# Patient Record
Sex: Male | Born: 1935 | Race: White | Marital: Married | Smoking: Former smoker
Health system: Southern US, Community
[De-identification: ages and names within clinical notes are randomized; demographics above are authoritative.]

## PROBLEM LIST (undated history)

## (undated) DIAGNOSIS — I639 Cerebral infarction, unspecified: Secondary | ICD-10-CM

## (undated) DIAGNOSIS — I10 Essential (primary) hypertension: Secondary | ICD-10-CM

## (undated) DIAGNOSIS — C801 Malignant (primary) neoplasm, unspecified: Secondary | ICD-10-CM

## (undated) DIAGNOSIS — E785 Hyperlipidemia, unspecified: Secondary | ICD-10-CM

## (undated) DIAGNOSIS — I6529 Occlusion and stenosis of unspecified carotid artery: Secondary | ICD-10-CM

## (undated) HISTORY — DX: Malignant (primary) neoplasm, unspecified: C80.1

## (undated) HISTORY — DX: Hyperlipidemia, unspecified: E78.5

## (undated) HISTORY — DX: Cerebral infarction, unspecified: I63.9

## (undated) HISTORY — DX: Essential (primary) hypertension: I10

## (undated) HISTORY — DX: Occlusion and stenosis of unspecified carotid artery: I65.29

## (undated) HISTORY — PX: CATARACT EXTRACTION, BILATERAL: SHX1313

---

## 2009-12-29 ENCOUNTER — Ambulatory Visit: Payer: Self-pay | Admitting: Ophthalmology

## 2010-01-18 ENCOUNTER — Ambulatory Visit: Payer: Self-pay | Admitting: Vascular Surgery

## 2010-01-22 ENCOUNTER — Ambulatory Visit: Payer: Self-pay | Admitting: Vascular Surgery

## 2010-12-28 DIAGNOSIS — I359 Nonrheumatic aortic valve disorder, unspecified: Secondary | ICD-10-CM

## 2015-07-27 DIAGNOSIS — E119 Type 2 diabetes mellitus without complications: Secondary | ICD-10-CM | POA: Diagnosis not present

## 2015-07-27 DIAGNOSIS — Z79899 Other long term (current) drug therapy: Secondary | ICD-10-CM | POA: Diagnosis not present

## 2015-07-27 DIAGNOSIS — Z7982 Long term (current) use of aspirin: Secondary | ICD-10-CM | POA: Diagnosis not present

## 2015-07-27 DIAGNOSIS — Z87891 Personal history of nicotine dependence: Secondary | ICD-10-CM | POA: Diagnosis not present

## 2015-07-27 DIAGNOSIS — I10 Essential (primary) hypertension: Secondary | ICD-10-CM | POA: Diagnosis not present

## 2015-07-27 DIAGNOSIS — S46911A Strain of unspecified muscle, fascia and tendon at shoulder and upper arm level, right arm, initial encounter: Secondary | ICD-10-CM | POA: Diagnosis not present

## 2015-07-27 DIAGNOSIS — X58XXXA Exposure to other specified factors, initial encounter: Secondary | ICD-10-CM | POA: Diagnosis not present

## 2015-08-02 DIAGNOSIS — S46811A Strain of other muscles, fascia and tendons at shoulder and upper arm level, right arm, initial encounter: Secondary | ICD-10-CM | POA: Diagnosis not present

## 2015-08-02 DIAGNOSIS — R911 Solitary pulmonary nodule: Secondary | ICD-10-CM | POA: Diagnosis not present

## 2015-08-07 DIAGNOSIS — R911 Solitary pulmonary nodule: Secondary | ICD-10-CM | POA: Diagnosis not present

## 2015-08-08 DIAGNOSIS — I700 Atherosclerosis of aorta: Secondary | ICD-10-CM | POA: Diagnosis not present

## 2015-08-08 DIAGNOSIS — I2510 Atherosclerotic heart disease of native coronary artery without angina pectoris: Secondary | ICD-10-CM | POA: Diagnosis not present

## 2015-08-08 DIAGNOSIS — J929 Pleural plaque without asbestos: Secondary | ICD-10-CM | POA: Diagnosis not present

## 2015-08-08 DIAGNOSIS — R911 Solitary pulmonary nodule: Secondary | ICD-10-CM | POA: Diagnosis not present

## 2015-08-28 DIAGNOSIS — I6523 Occlusion and stenosis of bilateral carotid arteries: Secondary | ICD-10-CM | POA: Diagnosis not present

## 2015-08-28 DIAGNOSIS — I10 Essential (primary) hypertension: Secondary | ICD-10-CM | POA: Diagnosis not present

## 2015-08-28 DIAGNOSIS — E785 Hyperlipidemia, unspecified: Secondary | ICD-10-CM | POA: Diagnosis not present

## 2015-09-06 DIAGNOSIS — N529 Male erectile dysfunction, unspecified: Secondary | ICD-10-CM | POA: Diagnosis not present

## 2015-09-06 DIAGNOSIS — Z6827 Body mass index (BMI) 27.0-27.9, adult: Secondary | ICD-10-CM | POA: Diagnosis not present

## 2015-09-06 DIAGNOSIS — Z1211 Encounter for screening for malignant neoplasm of colon: Secondary | ICD-10-CM | POA: Diagnosis not present

## 2015-09-06 DIAGNOSIS — Z0000 Encounter for general adult medical examination without abnormal findings: Secondary | ICD-10-CM | POA: Diagnosis not present

## 2015-09-06 DIAGNOSIS — E7801 Familial hypercholesterolemia: Secondary | ICD-10-CM | POA: Diagnosis not present

## 2015-09-06 DIAGNOSIS — I6529 Occlusion and stenosis of unspecified carotid artery: Secondary | ICD-10-CM | POA: Diagnosis not present

## 2015-09-06 DIAGNOSIS — I10 Essential (primary) hypertension: Secondary | ICD-10-CM | POA: Diagnosis not present

## 2015-09-06 DIAGNOSIS — E559 Vitamin D deficiency, unspecified: Secondary | ICD-10-CM | POA: Diagnosis not present

## 2015-10-06 DIAGNOSIS — D649 Anemia, unspecified: Secondary | ICD-10-CM | POA: Diagnosis not present

## 2015-11-14 DIAGNOSIS — D649 Anemia, unspecified: Secondary | ICD-10-CM | POA: Diagnosis not present

## 2015-11-14 DIAGNOSIS — R195 Other fecal abnormalities: Secondary | ICD-10-CM | POA: Diagnosis not present

## 2015-12-01 DIAGNOSIS — D128 Benign neoplasm of rectum: Secondary | ICD-10-CM | POA: Diagnosis not present

## 2015-12-01 DIAGNOSIS — I10 Essential (primary) hypertension: Secondary | ICD-10-CM | POA: Diagnosis not present

## 2015-12-01 DIAGNOSIS — I129 Hypertensive chronic kidney disease with stage 1 through stage 4 chronic kidney disease, or unspecified chronic kidney disease: Secondary | ICD-10-CM | POA: Diagnosis not present

## 2015-12-01 DIAGNOSIS — K209 Esophagitis, unspecified: Secondary | ICD-10-CM | POA: Diagnosis not present

## 2015-12-01 DIAGNOSIS — Z79899 Other long term (current) drug therapy: Secondary | ICD-10-CM | POA: Diagnosis not present

## 2015-12-01 DIAGNOSIS — E559 Vitamin D deficiency, unspecified: Secondary | ICD-10-CM | POA: Diagnosis not present

## 2015-12-01 DIAGNOSIS — K621 Rectal polyp: Secondary | ICD-10-CM | POA: Diagnosis not present

## 2015-12-01 DIAGNOSIS — Z87891 Personal history of nicotine dependence: Secondary | ICD-10-CM | POA: Diagnosis not present

## 2015-12-01 DIAGNOSIS — Z800 Family history of malignant neoplasm of digestive organs: Secondary | ICD-10-CM | POA: Diagnosis not present

## 2015-12-01 DIAGNOSIS — Z86010 Personal history of colonic polyps: Secondary | ICD-10-CM | POA: Diagnosis not present

## 2015-12-01 DIAGNOSIS — N189 Chronic kidney disease, unspecified: Secondary | ICD-10-CM | POA: Diagnosis not present

## 2015-12-01 DIAGNOSIS — D125 Benign neoplasm of sigmoid colon: Secondary | ICD-10-CM | POA: Diagnosis not present

## 2015-12-01 DIAGNOSIS — K222 Esophageal obstruction: Secondary | ICD-10-CM | POA: Diagnosis not present

## 2015-12-01 DIAGNOSIS — E1122 Type 2 diabetes mellitus with diabetic chronic kidney disease: Secondary | ICD-10-CM | POA: Diagnosis not present

## 2015-12-01 DIAGNOSIS — R195 Other fecal abnormalities: Secondary | ICD-10-CM | POA: Diagnosis not present

## 2015-12-01 DIAGNOSIS — Z7982 Long term (current) use of aspirin: Secondary | ICD-10-CM | POA: Diagnosis not present

## 2015-12-01 DIAGNOSIS — D649 Anemia, unspecified: Secondary | ICD-10-CM | POA: Diagnosis not present

## 2015-12-01 DIAGNOSIS — I2510 Atherosclerotic heart disease of native coronary artery without angina pectoris: Secondary | ICD-10-CM | POA: Diagnosis not present

## 2015-12-01 DIAGNOSIS — E785 Hyperlipidemia, unspecified: Secondary | ICD-10-CM | POA: Diagnosis not present

## 2015-12-01 DIAGNOSIS — K635 Polyp of colon: Secondary | ICD-10-CM | POA: Diagnosis not present

## 2015-12-01 DIAGNOSIS — N400 Enlarged prostate without lower urinary tract symptoms: Secondary | ICD-10-CM | POA: Diagnosis not present

## 2015-12-01 DIAGNOSIS — K2970 Gastritis, unspecified, without bleeding: Secondary | ICD-10-CM | POA: Diagnosis not present

## 2016-01-16 DIAGNOSIS — Z23 Encounter for immunization: Secondary | ICD-10-CM | POA: Diagnosis not present

## 2016-03-01 ENCOUNTER — Ambulatory Visit (INDEPENDENT_AMBULATORY_CARE_PROVIDER_SITE_OTHER): Payer: Medicare Other

## 2016-03-01 ENCOUNTER — Other Ambulatory Visit (INDEPENDENT_AMBULATORY_CARE_PROVIDER_SITE_OTHER): Payer: Self-pay | Admitting: Vascular Surgery

## 2016-03-01 ENCOUNTER — Ambulatory Visit (INDEPENDENT_AMBULATORY_CARE_PROVIDER_SITE_OTHER): Payer: Medicare Other | Admitting: Vascular Surgery

## 2016-03-01 ENCOUNTER — Encounter (INDEPENDENT_AMBULATORY_CARE_PROVIDER_SITE_OTHER): Payer: Self-pay | Admitting: Vascular Surgery

## 2016-03-01 VITALS — BP 143/62 | HR 81 | Resp 16 | Ht 69.5 in | Wt 174.0 lb

## 2016-03-01 DIAGNOSIS — I6523 Occlusion and stenosis of bilateral carotid arteries: Secondary | ICD-10-CM | POA: Diagnosis not present

## 2016-03-01 DIAGNOSIS — I6521 Occlusion and stenosis of right carotid artery: Secondary | ICD-10-CM | POA: Diagnosis not present

## 2016-03-01 DIAGNOSIS — I6522 Occlusion and stenosis of left carotid artery: Secondary | ICD-10-CM | POA: Diagnosis not present

## 2016-03-01 DIAGNOSIS — I10 Essential (primary) hypertension: Secondary | ICD-10-CM | POA: Diagnosis not present

## 2016-03-01 DIAGNOSIS — E785 Hyperlipidemia, unspecified: Secondary | ICD-10-CM

## 2016-03-01 DIAGNOSIS — I6529 Occlusion and stenosis of unspecified carotid artery: Secondary | ICD-10-CM | POA: Insufficient documentation

## 2016-03-01 NOTE — Assessment & Plan Note (Signed)
The patient's carotid duplex demonstrates a known right carotid artery occlusion and stable stenosis in the 40-59% range on the left. They're doing well without any focal neurologic symptoms. They will continue appropriate medical management with aspirin and a statin agent. We will plan to see them back in 12 months with follow-up duplex. They will contact our office with any problems or focal neurologic deficits in the interim.

## 2016-03-01 NOTE — Assessment & Plan Note (Signed)
blood pressure control important in reducing the progression of atherosclerotic disease. On appropriate oral medications.  

## 2016-03-01 NOTE — Progress Notes (Signed)
MRN : [id number]  Bryan Christian is a 80 y.o. (1935-04-25) male who presents with chief complaint of  Chief Complaint  Patient presents with  . Follow-up  .  History of Present Illness: Patient returns in follow-up of his carotid stenosis. He is doing well and denies focal neurologic symptoms. Specifically, the patient denies amaurosis fugax, speech or swallowing difficulties, or arm or leg weakness or numbness The patient's carotid duplex demonstrates a known right carotid artery occlusion and stable stenosis in the 40-59% range on the left.  Current Outpatient Prescriptions  Medication Sig Dispense Refill  . amLODipine (NORVASC) 5 MG tablet     . aspirin 81 MG chewable tablet Chew by mouth daily.    Marland Kitchen atorvastatin (LIPITOR) 80 MG tablet     . CIALIS 20 MG tablet     . folic acid (FOLVITE) 1 MG tablet     . hydrochlorothiazide (HYDRODIURIL) 25 MG tablet     . lisinopril (PRINIVIL,ZESTRIL) 40 MG tablet     . Omega-3 Fatty Acids (FISH OIL) 1000 MG CAPS Take by mouth daily.    Marland Kitchen omeprazole (PRILOSEC) 20 MG capsule      No current facility-administered medications for this visit.     Past Medical History:  Diagnosis Date  . Hyperlipidemia   . Hypertension     Past Surgical History:  Procedure Laterality Date  . CATARACT EXTRACTION, BILATERAL Bilateral     Social History Social History  Substance Use Topics  . Smoking status: Former Research scientist (life sciences)  . Smokeless tobacco: Never Used  . Alcohol use Yes    Family History Family History  Problem Relation Age of Onset  . Cancer Father     No Known Allergies   REVIEW OF SYSTEMS (Negative unless checked)  Constitutional: [] Weight loss  [] Fever  [] Chills Cardiac: [] Chest pain   [] Chest pressure   [] Palpitations   [] Shortness of breath when laying flat   [] Shortness of breath at rest   [] Shortness of breath with exertion. Vascular:  [] Pain in legs with walking   [] Pain in legs at rest   [] Pain in legs when laying flat    [] Claudication   [] Pain in feet when walking  [] Pain in feet at rest  [] Pain in feet when laying flat   [] History of DVT   [] Phlebitis   [] Swelling in legs   [] Varicose veins   [] Non-healing ulcers Pulmonary:   [] Uses home oxygen   [] Productive cough   [] Hemoptysis   [] Wheeze  [] COPD   [] Asthma Neurologic:  [] Dizziness  [] Blackouts   [] Seizures   [] History of stroke   [] History of TIA  [] Aphasia   [] Temporary blindness   [] Dysphagia   [] Weakness or numbness in arms   [] Weakness or numbness in legs Musculoskeletal:  [] Arthritis   [] Joint swelling   [] Joint pain   [] Low back pain Hematologic:  [] Easy bruising  [] Easy bleeding   [] Hypercoagulable state   [] Anemic  [] Hepatitis Gastrointestinal:  [] Blood in stool   [] Vomiting blood  [] Gastroesophageal reflux/heartburn   [] Difficulty swallowing. Genitourinary:  [] Chronic kidney disease   [] Difficult urination  [] Frequent urination  [] Burning with urination   [] Blood in urine Skin:  [] Rashes   [] Ulcers   [] Wounds Psychological:  [] History of anxiety   []  History of major depression.  Physical Examination  Vitals:   03/01/16 1428 03/01/16 1429  BP: (!) 145/65 (!) 143/62  Pulse: 81   Resp: 16   Weight: 174 lb (78.9 kg)   Height: 5' 9.5" (  1.765 m)    Body mass index is 25.33 kg/m. Gen:  WD/WN, NAD Head: [place]/AT, No temporalis wasting. Ear/Nose/Throat: Hearing grossly intact, nares w/o erythema or drainage, trachea midline Eyes: Conjunctiva clear. Sclera non-icteric Neck: Supple.  No JVD.  Pulmonary:  Good air movement, equal and clear to auscultation bilaterally.  Cardiac: RRR, normal S1, S2, Loud systolic murmur present Vascular:  Vessel Right Left  Radial Palpable Palpable  Ulnar Palpable Palpable  Brachial Palpable Palpable  Carotid No bruit Bruit  Aorta Not palpable N/A  Femoral Palpable Palpable  Popliteal Palpable Palpable  PT Palpable Palpable  DP Palpable Palpable   Gastrointestinal: soft, non-tender/non-distended. No  guarding/reflex.  Musculoskeletal: M/S 5/5 throughout.  No deformity or atrophy. Neurologic: CN 2-12 intact. Sensation grossly intact in extremities.  Symmetrical.  Speech is fluent. Motor exam as listed above. Psychiatric: Judgment intact, Mood & affect appropriate for pt's clinical situation. Dermatologic: No rashes or ulcers noted.  No cellulitis or open wounds. Lymph : No Cervical, Axillary, or Inguinal lymphadenopathy.     CBC No results found for: WBC, HGB, HCT, MCV, PLT  BMET No results found for: NA, K, CL, CO2, GLUCOSE, BUN, CREATININE, CALCIUM, GFRNONAA, GFRAA CrCl cannot be calculated (No order found.).  COAG No results found for: INR, PROTIME  Radiology No results found.    Assessment/Plan Hyperlipidemia lipid control important in reducing the progression of atherosclerotic disease. Continue statin therapy   Essential hypertension blood pressure control important in reducing the progression of atherosclerotic disease. On appropriate oral medications.   Carotid stenosis The patient's carotid duplex demonstrates a known right carotid artery occlusion and stable stenosis in the 40-59% range on the left. They're doing well without any focal neurologic symptoms. They will continue appropriate medical management with aspirin and a statin agent. We will plan to see them back in 12 months with follow-up duplex. They will contact our office with any problems or focal neurologic deficits in the interim.     Leotis Pain, MD  03/01/2016 3:17 PM    This note was created with Dragon medical transcription system.  Any errors from dictation are purely unintentional

## 2016-03-01 NOTE — Assessment & Plan Note (Signed)
lipid control important in reducing the progression of atherosclerotic disease. Continue statin therapy  

## 2016-04-24 DIAGNOSIS — K219 Gastro-esophageal reflux disease without esophagitis: Secondary | ICD-10-CM | POA: Diagnosis not present

## 2016-10-10 DIAGNOSIS — Z299 Encounter for prophylactic measures, unspecified: Secondary | ICD-10-CM | POA: Diagnosis not present

## 2016-10-10 DIAGNOSIS — E7800 Pure hypercholesterolemia, unspecified: Secondary | ICD-10-CM | POA: Diagnosis not present

## 2016-10-10 DIAGNOSIS — Z87891 Personal history of nicotine dependence: Secondary | ICD-10-CM | POA: Diagnosis not present

## 2016-10-10 DIAGNOSIS — K210 Gastro-esophageal reflux disease with esophagitis: Secondary | ICD-10-CM | POA: Diagnosis not present

## 2016-10-10 DIAGNOSIS — N529 Male erectile dysfunction, unspecified: Secondary | ICD-10-CM | POA: Diagnosis not present

## 2016-10-10 DIAGNOSIS — Z6825 Body mass index (BMI) 25.0-25.9, adult: Secondary | ICD-10-CM | POA: Diagnosis not present

## 2016-10-10 DIAGNOSIS — I6521 Occlusion and stenosis of right carotid artery: Secondary | ICD-10-CM | POA: Diagnosis not present

## 2016-10-10 DIAGNOSIS — I10 Essential (primary) hypertension: Secondary | ICD-10-CM | POA: Diagnosis not present

## 2016-10-10 DIAGNOSIS — Z713 Dietary counseling and surveillance: Secondary | ICD-10-CM | POA: Diagnosis not present

## 2016-10-10 DIAGNOSIS — I350 Nonrheumatic aortic (valve) stenosis: Secondary | ICD-10-CM | POA: Diagnosis not present

## 2016-11-12 DIAGNOSIS — I10 Essential (primary) hypertension: Secondary | ICD-10-CM | POA: Diagnosis not present

## 2016-11-12 DIAGNOSIS — Z1389 Encounter for screening for other disorder: Secondary | ICD-10-CM | POA: Diagnosis not present

## 2016-11-12 DIAGNOSIS — Z6825 Body mass index (BMI) 25.0-25.9, adult: Secondary | ICD-10-CM | POA: Diagnosis not present

## 2016-11-12 DIAGNOSIS — Z299 Encounter for prophylactic measures, unspecified: Secondary | ICD-10-CM | POA: Diagnosis not present

## 2016-11-12 DIAGNOSIS — Z1211 Encounter for screening for malignant neoplasm of colon: Secondary | ICD-10-CM | POA: Diagnosis not present

## 2016-11-12 DIAGNOSIS — Z0000 Encounter for general adult medical examination without abnormal findings: Secondary | ICD-10-CM | POA: Diagnosis not present

## 2016-11-12 DIAGNOSIS — Z7189 Other specified counseling: Secondary | ICD-10-CM | POA: Diagnosis not present

## 2016-11-13 DIAGNOSIS — R5383 Other fatigue: Secondary | ICD-10-CM | POA: Diagnosis not present

## 2016-11-13 DIAGNOSIS — Z125 Encounter for screening for malignant neoplasm of prostate: Secondary | ICD-10-CM | POA: Diagnosis not present

## 2016-11-13 DIAGNOSIS — E7800 Pure hypercholesterolemia, unspecified: Secondary | ICD-10-CM | POA: Diagnosis not present

## 2016-11-18 DIAGNOSIS — Z299 Encounter for prophylactic measures, unspecified: Secondary | ICD-10-CM | POA: Diagnosis not present

## 2016-11-18 DIAGNOSIS — N183 Chronic kidney disease, stage 3 (moderate): Secondary | ICD-10-CM | POA: Diagnosis not present

## 2016-11-18 DIAGNOSIS — Z6825 Body mass index (BMI) 25.0-25.9, adult: Secondary | ICD-10-CM | POA: Diagnosis not present

## 2016-11-18 DIAGNOSIS — K210 Gastro-esophageal reflux disease with esophagitis: Secondary | ICD-10-CM | POA: Diagnosis not present

## 2016-11-18 DIAGNOSIS — I10 Essential (primary) hypertension: Secondary | ICD-10-CM | POA: Diagnosis not present

## 2016-11-18 DIAGNOSIS — R011 Cardiac murmur, unspecified: Secondary | ICD-10-CM | POA: Diagnosis not present

## 2016-11-18 DIAGNOSIS — E7800 Pure hypercholesterolemia, unspecified: Secondary | ICD-10-CM | POA: Diagnosis not present

## 2016-11-18 DIAGNOSIS — Z713 Dietary counseling and surveillance: Secondary | ICD-10-CM | POA: Diagnosis not present

## 2016-11-18 DIAGNOSIS — D649 Anemia, unspecified: Secondary | ICD-10-CM | POA: Diagnosis not present

## 2017-02-18 DIAGNOSIS — K210 Gastro-esophageal reflux disease with esophagitis: Secondary | ICD-10-CM | POA: Diagnosis not present

## 2017-02-18 DIAGNOSIS — E7800 Pure hypercholesterolemia, unspecified: Secondary | ICD-10-CM | POA: Diagnosis not present

## 2017-02-18 DIAGNOSIS — Z6825 Body mass index (BMI) 25.0-25.9, adult: Secondary | ICD-10-CM | POA: Diagnosis not present

## 2017-02-18 DIAGNOSIS — I10 Essential (primary) hypertension: Secondary | ICD-10-CM | POA: Diagnosis not present

## 2017-02-18 DIAGNOSIS — Z299 Encounter for prophylactic measures, unspecified: Secondary | ICD-10-CM | POA: Diagnosis not present

## 2017-02-18 DIAGNOSIS — D649 Anemia, unspecified: Secondary | ICD-10-CM | POA: Diagnosis not present

## 2017-02-18 DIAGNOSIS — N183 Chronic kidney disease, stage 3 (moderate): Secondary | ICD-10-CM | POA: Diagnosis not present

## 2017-02-18 DIAGNOSIS — Z87891 Personal history of nicotine dependence: Secondary | ICD-10-CM | POA: Diagnosis not present

## 2017-03-04 ENCOUNTER — Ambulatory Visit (INDEPENDENT_AMBULATORY_CARE_PROVIDER_SITE_OTHER): Payer: Medicare Other

## 2017-03-04 ENCOUNTER — Ambulatory Visit (INDEPENDENT_AMBULATORY_CARE_PROVIDER_SITE_OTHER): Payer: Medicare Other | Admitting: Vascular Surgery

## 2017-03-04 ENCOUNTER — Encounter (INDEPENDENT_AMBULATORY_CARE_PROVIDER_SITE_OTHER): Payer: Self-pay

## 2017-03-04 ENCOUNTER — Encounter (INDEPENDENT_AMBULATORY_CARE_PROVIDER_SITE_OTHER): Payer: Self-pay | Admitting: Vascular Surgery

## 2017-03-04 VITALS — BP 124/64 | HR 67 | Resp 17 | Wt 168.0 lb

## 2017-03-04 DIAGNOSIS — E785 Hyperlipidemia, unspecified: Secondary | ICD-10-CM | POA: Diagnosis not present

## 2017-03-04 DIAGNOSIS — I6523 Occlusion and stenosis of bilateral carotid arteries: Secondary | ICD-10-CM

## 2017-03-04 DIAGNOSIS — I10 Essential (primary) hypertension: Secondary | ICD-10-CM

## 2017-03-04 NOTE — Patient Instructions (Signed)
Carotid Artery Disease The carotid arteries are arteries on both sides of the neck. They carry blood to the brain. Carotid artery disease is when the arteries get smaller (narrow) or get blocked. If these arteries get smaller or get blocked, you are more likely to have a stroke or warning stroke (transient ischemic attack). Follow these instructions at home:  Take medicines as told by your doctor. Make sure you understand all your medicine instructions. Do not stop your medicines without talking to your doctor first.  Follow your doctor's diet instructions. It is important to eat a healthy diet that includes plenty of: ? Fresh fruits. ? Vegetables. ? Lean meats.  Avoid: ? High-fat foods. ? High-sodium foods. ? Foods that are fried, overly processed, or have poor nutritional value.  Stay a healthy weight.  Stay active. Get at least 30 minutes of activity every day.  Do not smoke.  Limit alcohol use to: ? No more than 2 drinks a day for men. ? No more than 1 drink a day for women who are not pregnant.  Do not use illegal drugs.  Keep all doctor visits as told. Get help right away if:  You have sudden weakness or loss of feeling (numbness) on one side of the body, such as the face, arm, or leg.  You have sudden confusion.  You have trouble speaking (aphasia) or understanding.  You have sudden trouble seeing out of one or both eyes.  You have sudden trouble walking.  You have dizziness or feel like you might pass out (faint).  You have a loss of balance or your movements are not steady (uncoordinated).  You have a sudden, severe headache with no known cause.  You have trouble swallowing (dysphagia). Call your local emergency services (911 in [place] Do notdrive yourself to the clinic or hospital. This information is not intended to replace advice given to you by your health care provider. Make sure you discuss any questions you have with your health care  provider. Document Released: 03/18/2012 Document Revised: 09/07/2015 Document Reviewed: 09/30/2012 Elsevier Interactive Patient Education  2018 Elsevier Inc.  

## 2017-03-04 NOTE — Assessment & Plan Note (Signed)
His carotid duplex today suggests a small amount of trickle flow in the right internal carotid artery with what was previously thought to be an occlusion.  We cannot see the distal internal carotid artery to discern whether or not there is an occlusion distally.  The left carotid artery remains in the 40-59% range which may be somewhat elevated in part due to compensatory flow.  This is stable from his previous study. We discussed this finding today.  I have offered him an angiogram for further evaluation of this to discern whether or not this could be a treatable lesion.  He basically says "it has been like this for 20 years and I do not have any symptoms so I am comfortable continuing monitoring this annually".  As such, he will continue his current medical regimen including aspirin and Lipitor and we will plan a carotid duplex in 1 year.

## 2017-03-04 NOTE — Progress Notes (Signed)
MRN : [id number]  Bryan Christian is a 81 y.o. (01-20-1936) male who presents with chief complaint of  Chief Complaint  Patient presents with  . Carotid    23yr follow up  .  History of Present Illness: Patient returns in follow-up.  He denies any new symptoms or complaints.  He feels well today and is in his usual state of health.  He denies focal neurologic symptoms such as arm or leg weakness or numbness, speech or swallowing difficulty, or temporary monocular blindness.  His carotid duplex today suggests a small amount of trickle flow in the right internal carotid artery with what was previously thought to be an occlusion.  We cannot see the distal internal carotid artery to discern whether or not there is an occlusion distally.  The left carotid artery remains in the 40-59% range which may be somewhat elevated in part due to compensatory flow.  This is stable from his previous study.         Current Outpatient Prescriptions  Medication Sig Dispense Refill  . amLODipine (NORVASC) 5 MG tablet     . aspirin 81 MG chewable tablet Chew by mouth daily.    Marland Kitchen atorvastatin (LIPITOR) 80 MG tablet     . CIALIS 20 MG tablet     . folic acid (FOLVITE) 1 MG tablet     . hydrochlorothiazide (HYDRODIURIL) 25 MG tablet     . lisinopril (PRINIVIL,ZESTRIL) 40 MG tablet     . Omega-3 Fatty Acids (FISH OIL) 1000 MG CAPS Take by mouth daily.    Marland Kitchen omeprazole (PRILOSEC) 20 MG capsule      No current facility-administered medications for this visit.         Past Medical History:  Diagnosis Date  . Hyperlipidemia   . Hypertension          Past Surgical History:  Procedure Laterality Date  . CATARACT EXTRACTION, BILATERAL Bilateral     Social History     Social History  Substance Use Topics  . Smoking status: Former Research scientist (life sciences)  . Smokeless tobacco: Never Used  . Alcohol use Yes    Family History      Family History  Problem Relation Age of Onset  .  Cancer Father     No Known Allergies   REVIEW OF SYSTEMS (Negative unless checked)  Constitutional: [] Weight loss  [] Fever  [] Chills Cardiac: [] Chest pain   [] Chest pressure   [] Palpitations   [] Shortness of breath when laying flat   [] Shortness of breath at rest   [] Shortness of breath with exertion. Vascular:  [] Pain in legs with walking   [] Pain in legs at rest   [] Pain in legs when laying flat   [] Claudication   [] Pain in feet when walking  [] Pain in feet at rest  [] Pain in feet when laying flat   [] History of DVT   [] Phlebitis   [] Swelling in legs   [] Varicose veins   [] Non-healing ulcers Pulmonary:   [] Uses home oxygen   [] Productive cough   [] Hemoptysis   [] Wheeze  [] COPD   [] Asthma Neurologic:  [] Dizziness  [] Blackouts   [] Seizures   [] History of stroke   [] History of TIA  [] Aphasia   [] Temporary blindness   [] Dysphagia   [] Weakness or numbness in arms   [] Weakness or numbness in legs Musculoskeletal:  [x] Arthritis   [] Joint swelling   [] Joint pain   [] Low back pain Hematologic:  [] Easy bruising  [] Easy bleeding   [] Hypercoagulable state   []   Anemic  [] Hepatitis Gastrointestinal:  [] Blood in stool   [] Vomiting blood  [] Gastroesophageal reflux/heartburn   [] Difficulty swallowing. Genitourinary:  [] Chronic kidney disease   [] Difficult urination  [] Frequent urination  [] Burning with urination   [] Blood in urine Skin:  [] Rashes   [] Ulcers   [] Wounds Psychological:  [] History of anxiety   []  History of major depression.     Physical Examination  Vitals:   03/04/17 1521 03/04/17 1522  BP: 136/71 124/64  Pulse: 76 67  Resp: 17   Weight: 76.2 kg (168 lb)    Body mass index is 24.45 kg/m. Gen:  WD/WN, NAD.  younger than stated age Head: [place]/AT, No temporalis wasting. Ear/Nose/Throat: Hearing grossly intact, nares w/o erythema or drainage, trachea midline Eyes: Conjunctiva clear. Sclera non-icteric Neck: Supple.  No JVD.  Bilateral carotid bruits seem to be present although  some of this may be a transmitted systolic murmur into the neck Pulmonary:  Good air movement, equal and clear to auscultation bilaterally.  Cardiac: RRR, normal S1, S2, loud systolic murmur Vascular:  Vessel Right Left  Radial Palpable Palpable                                    Musculoskeletal: M/S 5/5 throughout.  No deformity or atrophy.  Neurologic: CN 2-12 intact. Sensation grossly intact in extremities.  Symmetrical.  Speech is fluent. Motor exam as listed above. Psychiatric: Judgment intact, Mood & affect appropriate for pt's clinical situation. Dermatologic: No rashes or ulcers noted.  No cellulitis or open wounds.      CBC No results found for: WBC, HGB, HCT, MCV, PLT  BMET No results found for: NA, K, CL, CO2, GLUCOSE, BUN, CREATININE, CALCIUM, GFRNONAA, GFRAA CrCl cannot be calculated (No order found.).  COAG No results found for: INR, PROTIME  Radiology No results found.    Assessment/Plan Hyperlipidemia lipid control important in reducing the progression of atherosclerotic disease. Continue statin therapy   Essential hypertension blood pressure control important in reducing the progression of atherosclerotic disease. On appropriate oral medications.   Carotid stenosis His carotid duplex today suggests a small amount of trickle flow in the right internal carotid artery with what was previously thought to be an occlusion.  We cannot see the distal internal carotid artery to discern whether or not there is an occlusion distally.  The left carotid artery remains in the 40-59% range which may be somewhat elevated in part due to compensatory flow.  This is stable from his previous study. We discussed this finding today.  I have offered him an angiogram for further evaluation of this to discern whether or not this could be a treatable lesion.  He basically says "it has been like this for 20 years and I do not have any symptoms so I am comfortable  continuing monitoring this annually".  As such, he will continue his current medical regimen including aspirin and Lipitor and we will plan a carotid duplex in 1 year.    Leotis Pain, MD  03/04/2017 3:58 PM    This note was created with Dragon medical transcription system.  Any errors from dictation are purely unintentional

## 2017-06-10 DIAGNOSIS — D649 Anemia, unspecified: Secondary | ICD-10-CM | POA: Diagnosis not present

## 2017-06-10 DIAGNOSIS — Z6825 Body mass index (BMI) 25.0-25.9, adult: Secondary | ICD-10-CM | POA: Diagnosis not present

## 2017-06-10 DIAGNOSIS — I10 Essential (primary) hypertension: Secondary | ICD-10-CM | POA: Diagnosis not present

## 2017-06-10 DIAGNOSIS — Z87891 Personal history of nicotine dependence: Secondary | ICD-10-CM | POA: Diagnosis not present

## 2017-06-10 DIAGNOSIS — Z299 Encounter for prophylactic measures, unspecified: Secondary | ICD-10-CM | POA: Diagnosis not present

## 2017-06-10 DIAGNOSIS — N183 Chronic kidney disease, stage 3 (moderate): Secondary | ICD-10-CM | POA: Diagnosis not present

## 2017-09-09 DIAGNOSIS — I350 Nonrheumatic aortic (valve) stenosis: Secondary | ICD-10-CM | POA: Diagnosis not present

## 2017-09-09 DIAGNOSIS — I10 Essential (primary) hypertension: Secondary | ICD-10-CM | POA: Diagnosis not present

## 2017-09-09 DIAGNOSIS — D649 Anemia, unspecified: Secondary | ICD-10-CM | POA: Diagnosis not present

## 2017-09-09 DIAGNOSIS — N183 Chronic kidney disease, stage 3 (moderate): Secondary | ICD-10-CM | POA: Diagnosis not present

## 2017-09-09 DIAGNOSIS — Z6825 Body mass index (BMI) 25.0-25.9, adult: Secondary | ICD-10-CM | POA: Diagnosis not present

## 2017-09-09 DIAGNOSIS — Z299 Encounter for prophylactic measures, unspecified: Secondary | ICD-10-CM | POA: Diagnosis not present

## 2017-11-18 DIAGNOSIS — Z1339 Encounter for screening examination for other mental health and behavioral disorders: Secondary | ICD-10-CM | POA: Diagnosis not present

## 2017-11-18 DIAGNOSIS — Z7189 Other specified counseling: Secondary | ICD-10-CM | POA: Diagnosis not present

## 2017-11-18 DIAGNOSIS — E7800 Pure hypercholesterolemia, unspecified: Secondary | ICD-10-CM | POA: Diagnosis not present

## 2017-11-18 DIAGNOSIS — Z6824 Body mass index (BMI) 24.0-24.9, adult: Secondary | ICD-10-CM | POA: Diagnosis not present

## 2017-11-18 DIAGNOSIS — I350 Nonrheumatic aortic (valve) stenosis: Secondary | ICD-10-CM | POA: Diagnosis not present

## 2017-11-18 DIAGNOSIS — Z1331 Encounter for screening for depression: Secondary | ICD-10-CM | POA: Diagnosis not present

## 2017-11-18 DIAGNOSIS — I10 Essential (primary) hypertension: Secondary | ICD-10-CM | POA: Diagnosis not present

## 2017-11-18 DIAGNOSIS — Z1211 Encounter for screening for malignant neoplasm of colon: Secondary | ICD-10-CM | POA: Diagnosis not present

## 2017-11-18 DIAGNOSIS — Z79899 Other long term (current) drug therapy: Secondary | ICD-10-CM | POA: Diagnosis not present

## 2017-11-18 DIAGNOSIS — R5383 Other fatigue: Secondary | ICD-10-CM | POA: Diagnosis not present

## 2017-11-18 DIAGNOSIS — Z299 Encounter for prophylactic measures, unspecified: Secondary | ICD-10-CM | POA: Diagnosis not present

## 2017-11-18 DIAGNOSIS — Z0000 Encounter for general adult medical examination without abnormal findings: Secondary | ICD-10-CM | POA: Diagnosis not present

## 2017-11-18 DIAGNOSIS — Z125 Encounter for screening for malignant neoplasm of prostate: Secondary | ICD-10-CM | POA: Diagnosis not present

## 2017-11-24 DIAGNOSIS — I350 Nonrheumatic aortic (valve) stenosis: Secondary | ICD-10-CM | POA: Diagnosis not present

## 2017-12-02 DIAGNOSIS — E7800 Pure hypercholesterolemia, unspecified: Secondary | ICD-10-CM | POA: Diagnosis not present

## 2017-12-02 DIAGNOSIS — Z299 Encounter for prophylactic measures, unspecified: Secondary | ICD-10-CM | POA: Diagnosis not present

## 2017-12-02 DIAGNOSIS — I10 Essential (primary) hypertension: Secondary | ICD-10-CM | POA: Diagnosis not present

## 2017-12-02 DIAGNOSIS — Z6824 Body mass index (BMI) 24.0-24.9, adult: Secondary | ICD-10-CM | POA: Diagnosis not present

## 2017-12-02 DIAGNOSIS — R0981 Nasal congestion: Secondary | ICD-10-CM | POA: Diagnosis not present

## 2017-12-02 DIAGNOSIS — I350 Nonrheumatic aortic (valve) stenosis: Secondary | ICD-10-CM | POA: Diagnosis not present

## 2017-12-08 DIAGNOSIS — I672 Cerebral atherosclerosis: Secondary | ICD-10-CM | POA: Diagnosis not present

## 2017-12-08 DIAGNOSIS — I10 Essential (primary) hypertension: Secondary | ICD-10-CM | POA: Diagnosis not present

## 2017-12-08 DIAGNOSIS — I4891 Unspecified atrial fibrillation: Secondary | ICD-10-CM | POA: Diagnosis not present

## 2017-12-08 DIAGNOSIS — Z8673 Personal history of transient ischemic attack (TIA), and cerebral infarction without residual deficits: Secondary | ICD-10-CM | POA: Diagnosis not present

## 2017-12-08 DIAGNOSIS — I350 Nonrheumatic aortic (valve) stenosis: Secondary | ICD-10-CM | POA: Diagnosis not present

## 2017-12-08 DIAGNOSIS — E785 Hyperlipidemia, unspecified: Secondary | ICD-10-CM | POA: Diagnosis not present

## 2017-12-08 DIAGNOSIS — I6523 Occlusion and stenosis of bilateral carotid arteries: Secondary | ICD-10-CM | POA: Diagnosis not present

## 2017-12-17 DIAGNOSIS — R319 Hematuria, unspecified: Secondary | ICD-10-CM | POA: Diagnosis not present

## 2017-12-17 DIAGNOSIS — E7800 Pure hypercholesterolemia, unspecified: Secondary | ICD-10-CM | POA: Diagnosis not present

## 2017-12-17 DIAGNOSIS — E119 Type 2 diabetes mellitus without complications: Secondary | ICD-10-CM | POA: Diagnosis not present

## 2017-12-17 DIAGNOSIS — I10 Essential (primary) hypertension: Secondary | ICD-10-CM | POA: Diagnosis not present

## 2017-12-17 DIAGNOSIS — T45515A Adverse effect of anticoagulants, initial encounter: Secondary | ICD-10-CM | POA: Diagnosis not present

## 2017-12-17 DIAGNOSIS — Z79899 Other long term (current) drug therapy: Secondary | ICD-10-CM | POA: Diagnosis not present

## 2018-02-08 DIAGNOSIS — R339 Retention of urine, unspecified: Secondary | ICD-10-CM | POA: Diagnosis not present

## 2018-02-08 DIAGNOSIS — I10 Essential (primary) hypertension: Secondary | ICD-10-CM | POA: Diagnosis not present

## 2018-02-08 DIAGNOSIS — I482 Chronic atrial fibrillation, unspecified: Secondary | ICD-10-CM | POA: Diagnosis not present

## 2018-02-08 DIAGNOSIS — B353 Tinea pedis: Secondary | ICD-10-CM | POA: Diagnosis not present

## 2018-02-08 DIAGNOSIS — Z79899 Other long term (current) drug therapy: Secondary | ICD-10-CM | POA: Diagnosis not present

## 2018-02-08 DIAGNOSIS — I4891 Unspecified atrial fibrillation: Secondary | ICD-10-CM | POA: Diagnosis not present

## 2018-02-08 DIAGNOSIS — Z87891 Personal history of nicotine dependence: Secondary | ICD-10-CM | POA: Diagnosis not present

## 2018-02-08 DIAGNOSIS — R319 Hematuria, unspecified: Secondary | ICD-10-CM | POA: Diagnosis not present

## 2018-02-08 DIAGNOSIS — E119 Type 2 diabetes mellitus without complications: Secondary | ICD-10-CM | POA: Diagnosis not present

## 2018-02-08 DIAGNOSIS — Z7982 Long term (current) use of aspirin: Secondary | ICD-10-CM | POA: Diagnosis not present

## 2018-02-10 DIAGNOSIS — I10 Essential (primary) hypertension: Secondary | ICD-10-CM | POA: Diagnosis not present

## 2018-02-10 DIAGNOSIS — Z299 Encounter for prophylactic measures, unspecified: Secondary | ICD-10-CM | POA: Diagnosis not present

## 2018-02-10 DIAGNOSIS — Z6824 Body mass index (BMI) 24.0-24.9, adult: Secondary | ICD-10-CM | POA: Diagnosis not present

## 2018-02-10 DIAGNOSIS — I350 Nonrheumatic aortic (valve) stenosis: Secondary | ICD-10-CM | POA: Diagnosis not present

## 2018-02-10 DIAGNOSIS — N400 Enlarged prostate without lower urinary tract symptoms: Secondary | ICD-10-CM | POA: Diagnosis not present

## 2018-02-10 DIAGNOSIS — N183 Chronic kidney disease, stage 3 (moderate): Secondary | ICD-10-CM | POA: Diagnosis not present

## 2018-02-12 DIAGNOSIS — I4891 Unspecified atrial fibrillation: Secondary | ICD-10-CM | POA: Diagnosis not present

## 2018-02-12 DIAGNOSIS — Z87448 Personal history of other diseases of urinary system: Secondary | ICD-10-CM | POA: Diagnosis not present

## 2018-02-12 DIAGNOSIS — Z466 Encounter for fitting and adjustment of urinary device: Secondary | ICD-10-CM | POA: Diagnosis not present

## 2018-02-12 DIAGNOSIS — R339 Retention of urine, unspecified: Secondary | ICD-10-CM | POA: Diagnosis not present

## 2018-02-13 DIAGNOSIS — I10 Essential (primary) hypertension: Secondary | ICD-10-CM | POA: Diagnosis present

## 2018-02-13 DIAGNOSIS — R739 Hyperglycemia, unspecified: Secondary | ICD-10-CM | POA: Diagnosis not present

## 2018-02-13 DIAGNOSIS — I4892 Unspecified atrial flutter: Secondary | ICD-10-CM | POA: Diagnosis not present

## 2018-02-13 DIAGNOSIS — Z79899 Other long term (current) drug therapy: Secondary | ICD-10-CM | POA: Diagnosis not present

## 2018-02-13 DIAGNOSIS — I083 Combined rheumatic disorders of mitral, aortic and tricuspid valves: Secondary | ICD-10-CM | POA: Diagnosis not present

## 2018-02-13 DIAGNOSIS — E1165 Type 2 diabetes mellitus with hyperglycemia: Secondary | ICD-10-CM | POA: Diagnosis not present

## 2018-02-13 DIAGNOSIS — G9389 Other specified disorders of brain: Secondary | ICD-10-CM | POA: Diagnosis not present

## 2018-02-13 DIAGNOSIS — I5189 Other ill-defined heart diseases: Secondary | ICD-10-CM | POA: Diagnosis not present

## 2018-02-13 DIAGNOSIS — I4891 Unspecified atrial fibrillation: Secondary | ICD-10-CM | POA: Diagnosis not present

## 2018-02-13 DIAGNOSIS — R000 Tachycardia, unspecified: Secondary | ICD-10-CM | POA: Diagnosis not present

## 2018-02-13 DIAGNOSIS — I499 Cardiac arrhythmia, unspecified: Secondary | ICD-10-CM | POA: Diagnosis not present

## 2018-02-13 DIAGNOSIS — R270 Ataxia, unspecified: Secondary | ICD-10-CM | POA: Diagnosis not present

## 2018-02-13 DIAGNOSIS — I480 Paroxysmal atrial fibrillation: Secondary | ICD-10-CM | POA: Diagnosis not present

## 2018-02-13 DIAGNOSIS — Z7901 Long term (current) use of anticoagulants: Secondary | ICD-10-CM | POA: Diagnosis not present

## 2018-02-13 DIAGNOSIS — I6389 Other cerebral infarction: Secondary | ICD-10-CM | POA: Diagnosis not present

## 2018-02-13 DIAGNOSIS — I959 Hypotension, unspecified: Secondary | ICD-10-CM | POA: Diagnosis not present

## 2018-02-13 DIAGNOSIS — I639 Cerebral infarction, unspecified: Secondary | ICD-10-CM | POA: Diagnosis not present

## 2018-02-13 DIAGNOSIS — R011 Cardiac murmur, unspecified: Secondary | ICD-10-CM | POA: Diagnosis not present

## 2018-02-13 DIAGNOSIS — R4701 Aphasia: Secondary | ICD-10-CM | POA: Diagnosis not present

## 2018-02-13 DIAGNOSIS — Z7982 Long term (current) use of aspirin: Secondary | ICD-10-CM | POA: Diagnosis not present

## 2018-02-13 DIAGNOSIS — I517 Cardiomegaly: Secondary | ICD-10-CM | POA: Diagnosis not present

## 2018-02-13 DIAGNOSIS — Z9282 Status post administration of tPA (rtPA) in a different facility within the last 24 hours prior to admission to current facility: Secondary | ICD-10-CM | POA: Diagnosis not present

## 2018-02-13 DIAGNOSIS — E7800 Pure hypercholesterolemia, unspecified: Secondary | ICD-10-CM | POA: Diagnosis not present

## 2018-02-13 DIAGNOSIS — G4489 Other headache syndrome: Secondary | ICD-10-CM | POA: Diagnosis not present

## 2018-02-13 DIAGNOSIS — R0689 Other abnormalities of breathing: Secondary | ICD-10-CM | POA: Diagnosis not present

## 2018-02-13 DIAGNOSIS — R29818 Other symptoms and signs involving the nervous system: Secondary | ICD-10-CM | POA: Diagnosis not present

## 2018-02-13 DIAGNOSIS — I668 Occlusion and stenosis of other cerebral arteries: Secondary | ICD-10-CM | POA: Diagnosis not present

## 2018-02-13 DIAGNOSIS — R404 Transient alteration of awareness: Secondary | ICD-10-CM | POA: Diagnosis not present

## 2018-02-13 DIAGNOSIS — I6523 Occlusion and stenosis of bilateral carotid arteries: Secondary | ICD-10-CM | POA: Diagnosis not present

## 2018-02-18 MED ORDER — HYDRALAZINE HCL 20 MG/ML IJ SOLN
10.00 | INTRAMUSCULAR | Status: DC
Start: ? — End: 2018-02-18

## 2018-02-18 MED ORDER — SODIUM CHLORIDE 0.9 % IV SOLN
10.00 | INTRAVENOUS | Status: DC
Start: ? — End: 2018-02-18

## 2018-02-18 MED ORDER — CALCIUM CARBONATE ANTACID 500 MG PO CHEW
1000.00 | CHEWABLE_TABLET | ORAL | Status: DC
Start: ? — End: 2018-02-18

## 2018-02-18 MED ORDER — MUPIROCIN 2 % EX OINT
TOPICAL_OINTMENT | CUTANEOUS | Status: DC
Start: 2018-02-16 — End: 2018-02-18

## 2018-02-18 MED ORDER — INSULIN LISPRO 100 UNIT/ML ~~LOC~~ SOLN
1.00 | SUBCUTANEOUS | Status: DC
Start: 2018-02-16 — End: 2018-02-18

## 2018-02-18 MED ORDER — METOPROLOL SUCCINATE ER 25 MG PO TB24
25.00 | ORAL_TABLET | ORAL | Status: DC
Start: 2018-02-17 — End: 2018-02-18

## 2018-02-18 MED ORDER — CLOPIDOGREL BISULFATE 75 MG PO TABS
75.00 | ORAL_TABLET | ORAL | Status: DC
Start: 2018-02-17 — End: 2018-02-18

## 2018-02-18 MED ORDER — INSULIN GLARGINE 100 UNIT/ML ~~LOC~~ SOLN
1.00 | SUBCUTANEOUS | Status: DC
Start: 2018-02-16 — End: 2018-02-18

## 2018-02-18 MED ORDER — ASPIRIN 81 MG PO CHEW
81.00 | CHEWABLE_TABLET | ORAL | Status: DC
Start: 2018-02-17 — End: 2018-02-18

## 2018-02-18 MED ORDER — ACETAMINOPHEN 325 MG PO TABS
650.00 | ORAL_TABLET | ORAL | Status: DC
Start: ? — End: 2018-02-18

## 2018-02-18 MED ORDER — TAMSULOSIN HCL 0.4 MG PO CAPS
0.40 | ORAL_CAPSULE | ORAL | Status: DC
Start: 2018-02-17 — End: 2018-02-18

## 2018-02-18 MED ORDER — GENERIC EXTERNAL MEDICATION
1.00 | Status: DC
Start: ? — End: 2018-02-18

## 2018-02-18 MED ORDER — INSULIN LISPRO 100 UNIT/ML ~~LOC~~ SOLN
1.00 | SUBCUTANEOUS | Status: DC
Start: ? — End: 2018-02-18

## 2018-02-18 MED ORDER — DOCUSATE SODIUM 100 MG PO CAPS
100.00 | ORAL_CAPSULE | ORAL | Status: DC
Start: 2018-02-16 — End: 2018-02-18

## 2018-02-18 MED ORDER — ACETAMINOPHEN 650 MG RE SUPP
650.00 | RECTAL | Status: DC
Start: ? — End: 2018-02-18

## 2018-02-18 MED ORDER — ALUM & MAG HYDROXIDE-SIMETH 200-200-20 MG/5ML PO SUSP
15.00 | ORAL | Status: DC
Start: ? — End: 2018-02-18

## 2018-02-18 MED ORDER — ATORVASTATIN CALCIUM 80 MG PO TABS
80.00 | ORAL_TABLET | ORAL | Status: DC
Start: 2018-02-16 — End: 2018-02-18

## 2018-02-18 MED ORDER — ONDANSETRON HCL 4 MG/2ML IJ SOLN
4.00 | INTRAMUSCULAR | Status: DC
Start: ? — End: 2018-02-18

## 2018-02-18 MED ORDER — SODIUM CHLORIDE 0.9 % IV SOLN
75.00 | INTRAVENOUS | Status: DC
Start: ? — End: 2018-02-18

## 2018-02-18 MED ORDER — HEPARIN SODIUM (PORCINE) 5000 UNIT/ML IJ SOLN
5000.00 | INTRAMUSCULAR | Status: DC
Start: 2018-02-16 — End: 2018-02-18

## 2018-02-18 MED ORDER — INSULIN GLARGINE 100 UNIT/ML ~~LOC~~ SOLN
1.00 | SUBCUTANEOUS | Status: DC
Start: ? — End: 2018-02-18

## 2018-02-18 MED ORDER — GENERIC EXTERNAL MEDICATION
10.00 | Status: DC
Start: ? — End: 2018-02-18

## 2018-02-24 DIAGNOSIS — I639 Cerebral infarction, unspecified: Secondary | ICD-10-CM | POA: Diagnosis not present

## 2018-02-27 DIAGNOSIS — R310 Gross hematuria: Secondary | ICD-10-CM | POA: Diagnosis not present

## 2018-02-27 DIAGNOSIS — R319 Hematuria, unspecified: Secondary | ICD-10-CM | POA: Diagnosis not present

## 2018-02-27 DIAGNOSIS — I708 Atherosclerosis of other arteries: Secondary | ICD-10-CM | POA: Diagnosis not present

## 2018-02-27 DIAGNOSIS — I700 Atherosclerosis of aorta: Secondary | ICD-10-CM | POA: Diagnosis not present

## 2018-02-27 DIAGNOSIS — I2510 Atherosclerotic heart disease of native coronary artery without angina pectoris: Secondary | ICD-10-CM | POA: Diagnosis not present

## 2018-03-03 ENCOUNTER — Ambulatory Visit (INDEPENDENT_AMBULATORY_CARE_PROVIDER_SITE_OTHER): Payer: Medicare Other | Admitting: Vascular Surgery

## 2018-03-03 ENCOUNTER — Encounter (INDEPENDENT_AMBULATORY_CARE_PROVIDER_SITE_OTHER): Payer: Self-pay | Admitting: Vascular Surgery

## 2018-03-03 ENCOUNTER — Ambulatory Visit (INDEPENDENT_AMBULATORY_CARE_PROVIDER_SITE_OTHER): Payer: Medicare Other

## 2018-03-03 VITALS — BP 137/80 | HR 83 | Resp 17 | Ht 70.0 in | Wt 166.0 lb

## 2018-03-03 DIAGNOSIS — I6523 Occlusion and stenosis of bilateral carotid arteries: Secondary | ICD-10-CM

## 2018-03-03 DIAGNOSIS — Z87891 Personal history of nicotine dependence: Secondary | ICD-10-CM | POA: Diagnosis not present

## 2018-03-03 DIAGNOSIS — I639 Cerebral infarction, unspecified: Secondary | ICD-10-CM | POA: Diagnosis not present

## 2018-03-03 DIAGNOSIS — E785 Hyperlipidemia, unspecified: Secondary | ICD-10-CM | POA: Diagnosis not present

## 2018-03-03 DIAGNOSIS — I10 Essential (primary) hypertension: Secondary | ICD-10-CM | POA: Diagnosis not present

## 2018-03-03 NOTE — Assessment & Plan Note (Signed)
Sounds like this was from atrial fibrillation.  He is now on anticoagulation.  He received TPA with a great result.

## 2018-03-03 NOTE — Assessment & Plan Note (Signed)
His carotid duplex shows no change from his ultrasound a year ago where there is occlusion or near occlusion of the right internal carotid artery which has been present for over a decade.  The left carotid artery velocities are on the upper end of the 1 to 39% range. No intervention recommended at this time.  Continue current medical regimen.  Recheck in 1 year

## 2018-03-03 NOTE — Progress Notes (Signed)
MRN : [id number]  Bryan Christian is a 82 y.o. (10-04-1935) male who presents with chief complaint of  Chief Complaint  Patient presents with  . Follow-up    1 year Carotid follow up  .  History of Present Illness: Patient returns in follow-up of his carotid disease.  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  He received TPA and has minimal residual deficits that I can appreciate.  His carotid duplex shows no change from his ultrasound a year ago where there is occlusion or near occlusion of the right internal carotid artery which has been present for over a decade.  The left carotid artery velocities are on the upper end of the 1 to 39% range.  Current Outpatient Medications  Medication Sig Dispense Refill  . amLODipine (NORVASC) 5 MG tablet     . aspirin 81 MG chewable tablet Chew by mouth daily.    Marland Kitchen atorvastatin (LIPITOR) 80 MG tablet     . azelastine (ASTELIN) 0.1 % nasal spray 2 sprays by Both Nostrils route 2 (two) times a day as needed.    Marland Kitchen CIALIS 20 MG tablet     . clopidogrel (PLAVIX) 75 MG tablet Take by mouth.    . folic acid (FOLVITE) 1 MG tablet     . hydrochlorothiazide (HYDRODIURIL) 25 MG tablet     . iron polysaccharides (NIFEREX) 150 MG capsule Take 150 mg by mouth daily.    Marland Kitchen lisinopril (PRINIVIL,ZESTRIL) 40 MG tablet     . metoprolol succinate (TOPROL-XL) 25 MG 24 hr tablet Take by mouth.    . Omega-3 Fatty Acids (FISH OIL) 1000 MG CAPS Take by mouth daily.    Marland Kitchen omeprazole (PRILOSEC) 20 MG capsule     . Rivaroxaban (XARELTO) 15 MG TABS tablet Take by mouth.    . tamsulosin (FLOMAX) 0.4 MG CAPS capsule TAKE 1 CAPSULE BY MOUTH ONCE DAILY TO HELP WITH URINE RETENTION  0   No current facility-administered medications for this visit.     Past Medical History:  Diagnosis Date  . Carotid artery occlusion   . Hyperlipidemia   . Hypertension   . Stroke [facility])     Past Surgical History:  Procedure Laterality Date    . CATARACT EXTRACTION, BILATERAL Bilateral     Social History  Substance Use Topics  . Smoking status: Former Research scientist (life sciences)  . Smokeless tobacco: Never Used  . Alcohol use Yes    Family History      Family History  Problem Relation Age of Onset  . Cancer Father     No Known Allergies   REVIEW OF SYSTEMS(Negative unless checked)  Constitutional: [] Weight loss[] Fever[] Chills Cardiac:[] Chest pain[] Chest pressure[] Palpitations [] Shortness of breath when laying flat [] Shortness of breath at rest [] Shortness of breath with exertion. Vascular: [] Pain in legs with walking[] Pain in legsat rest[] Pain in legs when laying flat [] Claudication [] Pain in feet when walking [] Pain in feet at rest [] Pain in feet when laying flat [] History of DVT [] Phlebitis [] Swelling in legs [] Varicose veins [] Non-healing ulcers Pulmonary: [] Uses home oxygen [] Productive cough[] Hemoptysis [] Wheeze [] COPD [] Asthma Neurologic: [] Dizziness [] Blackouts [] Seizures [x] History of stroke [] History of TIA[] Aphasia [] Temporary blindness[] Dysphagia [] Weaknessor numbness in arms [] Weakness or numbnessin legs Musculoskeletal: [x] Arthritis [] Joint swelling [] Joint pain [] Low back pain Hematologic:[] Easy bruising[] Easy bleeding [] Hypercoagulable state [] Anemic [] Hepatitis Gastrointestinal:[] Blood in stool[] Vomiting blood[] Gastroesophageal reflux/heartburn[] Difficulty swallowing. Genitourinary: [] Chronic kidney disease [] Difficulturination [] Frequenturination [] Burning with urination[] Blood in urine Skin: [] Rashes [] Ulcers [] Wounds Psychological: [] History of anxiety[] History of major  depression.    Physical Examination  Vitals:   03/03/18 1035 03/03/18 1036  BP: 135/89 137/80  Pulse: 92 83  Resp: 17   Weight: 166 lb (75.3 kg)   Height: 5\' 10"  (1.778 m)    Body mass index is 23.82 kg/m. Gen:   WD/WN, NAD. Appears younger than stated age Head: [place]/AT, No temporalis wasting. Ear/Nose/Throat: Hearing grossly intact, nares w/o erythema or drainage, trachea midline Eyes: Conjunctiva clear. Sclera non-icteric Neck: Supple.  Bilateral carotid bruits Pulmonary:  Good air movement, equal and clear to auscultation bilaterally.  Cardiac: RRR, No JVD Vascular:  Vessel Right Left  Radial Palpable Palpable                                     Neurologic: CN 2-12 intact. Sensation grossly intact in extremities.  Symmetrical.  Speech is fluent. Motor exam as listed above. Psychiatric: Judgment intact, Mood & affect appropriate for pt's clinical situation. Dermatologic: No rashes or ulcers noted.  No cellulitis or open wounds.      CBC No results found for: WBC, HGB, HCT, MCV, PLT  BMET No results found for: NA, K, CL, CO2, GLUCOSE, BUN, CREATININE, CALCIUM, GFRNONAA, GFRAA CrCl cannot be calculated (No successful lab value found.).  COAG No results found for: INR, PROTIME  Radiology No results found.    Assessment/Plan Hyperlipidemia lipid control important in reducing the progression of atherosclerotic disease. Continue statin therapy   Essential hypertension blood pressure control important in reducing the progression of atherosclerotic disease. On appropriate oral medications.  Stroke [facility]) Sounds like this was from atrial fibrillation.  He is now on anticoagulation.  He received TPA with a great result.  Carotid stenosis His carotid duplex shows no change from his ultrasound a year ago where there is occlusion or near occlusion of the right internal carotid artery which has been present for over a decade.  The left carotid artery velocities are on the upper end of the 1 to 39% range. No intervention recommended at this time.  Continue current medical regimen.  Recheck in 1 year    Leotis Pain, MD  03/03/2018 11:39 AM    This note was created with  Dragon medical transcription system.  Any errors from dictation are purely unintentional

## 2018-03-05 DIAGNOSIS — Z299 Encounter for prophylactic measures, unspecified: Secondary | ICD-10-CM | POA: Diagnosis not present

## 2018-03-05 DIAGNOSIS — N183 Chronic kidney disease, stage 3 (moderate): Secondary | ICD-10-CM | POA: Diagnosis not present

## 2018-03-05 DIAGNOSIS — I4891 Unspecified atrial fibrillation: Secondary | ICD-10-CM | POA: Diagnosis not present

## 2018-03-05 DIAGNOSIS — N400 Enlarged prostate without lower urinary tract symptoms: Secondary | ICD-10-CM | POA: Diagnosis not present

## 2018-03-05 DIAGNOSIS — Z6824 Body mass index (BMI) 24.0-24.9, adult: Secondary | ICD-10-CM | POA: Diagnosis not present

## 2018-03-05 DIAGNOSIS — I10 Essential (primary) hypertension: Secondary | ICD-10-CM | POA: Diagnosis not present

## 2018-03-18 DIAGNOSIS — R310 Gross hematuria: Secondary | ICD-10-CM | POA: Diagnosis not present

## 2018-03-18 DIAGNOSIS — I4811 Longstanding persistent atrial fibrillation: Secondary | ICD-10-CM | POA: Diagnosis not present

## 2018-03-18 DIAGNOSIS — I6310 Cerebral infarction due to embolism of unspecified precerebral artery: Secondary | ICD-10-CM | POA: Diagnosis not present

## 2018-03-18 DIAGNOSIS — I350 Nonrheumatic aortic (valve) stenosis: Secondary | ICD-10-CM | POA: Diagnosis not present

## 2018-03-20 DIAGNOSIS — I4891 Unspecified atrial fibrillation: Secondary | ICD-10-CM | POA: Insufficient documentation

## 2018-04-21 DIAGNOSIS — I4811 Longstanding persistent atrial fibrillation: Secondary | ICD-10-CM | POA: Diagnosis not present

## 2018-04-22 DIAGNOSIS — I4891 Unspecified atrial fibrillation: Secondary | ICD-10-CM | POA: Diagnosis not present

## 2018-04-22 DIAGNOSIS — I4819 Other persistent atrial fibrillation: Secondary | ICD-10-CM | POA: Diagnosis present

## 2018-04-22 DIAGNOSIS — I350 Nonrheumatic aortic (valve) stenosis: Secondary | ICD-10-CM | POA: Diagnosis present

## 2018-04-22 DIAGNOSIS — R9431 Abnormal electrocardiogram [ECG] [EKG]: Secondary | ICD-10-CM | POA: Diagnosis not present

## 2018-04-22 DIAGNOSIS — I10 Essential (primary) hypertension: Secondary | ICD-10-CM | POA: Diagnosis present

## 2018-04-22 DIAGNOSIS — Z7982 Long term (current) use of aspirin: Secondary | ICD-10-CM | POA: Diagnosis not present

## 2018-04-22 DIAGNOSIS — Z7901 Long term (current) use of anticoagulants: Secondary | ICD-10-CM | POA: Diagnosis not present

## 2018-04-22 DIAGNOSIS — Z8673 Personal history of transient ischemic attack (TIA), and cerebral infarction without residual deficits: Secondary | ICD-10-CM | POA: Diagnosis not present

## 2018-04-22 DIAGNOSIS — Z006 Encounter for examination for normal comparison and control in clinical research program: Secondary | ICD-10-CM | POA: Diagnosis not present

## 2018-04-29 DIAGNOSIS — I4811 Longstanding persistent atrial fibrillation: Secondary | ICD-10-CM | POA: Diagnosis not present

## 2018-04-29 DIAGNOSIS — I350 Nonrheumatic aortic (valve) stenosis: Secondary | ICD-10-CM | POA: Diagnosis not present

## 2018-04-29 DIAGNOSIS — R310 Gross hematuria: Secondary | ICD-10-CM | POA: Diagnosis not present

## 2018-04-29 DIAGNOSIS — I63119 Cerebral infarction due to embolism of unspecified vertebral artery: Secondary | ICD-10-CM | POA: Diagnosis not present

## 2018-05-07 DIAGNOSIS — Z6824 Body mass index (BMI) 24.0-24.9, adult: Secondary | ICD-10-CM | POA: Diagnosis not present

## 2018-05-07 DIAGNOSIS — Z299 Encounter for prophylactic measures, unspecified: Secondary | ICD-10-CM | POA: Diagnosis not present

## 2018-05-07 DIAGNOSIS — N183 Chronic kidney disease, stage 3 (moderate): Secondary | ICD-10-CM | POA: Diagnosis not present

## 2018-05-07 DIAGNOSIS — I10 Essential (primary) hypertension: Secondary | ICD-10-CM | POA: Diagnosis not present

## 2018-05-07 DIAGNOSIS — Z87891 Personal history of nicotine dependence: Secondary | ICD-10-CM | POA: Diagnosis not present

## 2018-05-07 DIAGNOSIS — I4891 Unspecified atrial fibrillation: Secondary | ICD-10-CM | POA: Diagnosis not present

## 2018-05-07 DIAGNOSIS — R21 Rash and other nonspecific skin eruption: Secondary | ICD-10-CM | POA: Diagnosis not present

## 2018-05-12 DIAGNOSIS — I63522 Cerebral infarction due to unspecified occlusion or stenosis of left anterior cerebral artery: Secondary | ICD-10-CM | POA: Diagnosis not present

## 2018-06-10 DIAGNOSIS — I350 Nonrheumatic aortic (valve) stenosis: Secondary | ICD-10-CM | POA: Diagnosis not present

## 2018-06-10 DIAGNOSIS — Q211 Atrial septal defect: Secondary | ICD-10-CM | POA: Diagnosis not present

## 2018-06-10 DIAGNOSIS — I4811 Longstanding persistent atrial fibrillation: Secondary | ICD-10-CM | POA: Diagnosis not present

## 2018-06-16 DIAGNOSIS — I10 Essential (primary) hypertension: Secondary | ICD-10-CM | POA: Diagnosis not present

## 2018-06-16 DIAGNOSIS — I4891 Unspecified atrial fibrillation: Secondary | ICD-10-CM | POA: Diagnosis not present

## 2018-06-16 DIAGNOSIS — R350 Frequency of micturition: Secondary | ICD-10-CM | POA: Diagnosis not present

## 2018-06-16 DIAGNOSIS — Z299 Encounter for prophylactic measures, unspecified: Secondary | ICD-10-CM | POA: Diagnosis not present

## 2018-06-16 DIAGNOSIS — R310 Gross hematuria: Secondary | ICD-10-CM | POA: Diagnosis not present

## 2018-06-16 DIAGNOSIS — N419 Inflammatory disease of prostate, unspecified: Secondary | ICD-10-CM | POA: Diagnosis not present

## 2018-07-07 DIAGNOSIS — R319 Hematuria, unspecified: Secondary | ICD-10-CM | POA: Diagnosis not present

## 2018-07-07 DIAGNOSIS — I4891 Unspecified atrial fibrillation: Secondary | ICD-10-CM | POA: Diagnosis not present

## 2018-07-07 DIAGNOSIS — I10 Essential (primary) hypertension: Secondary | ICD-10-CM | POA: Diagnosis not present

## 2018-07-07 DIAGNOSIS — N183 Chronic kidney disease, stage 3 (moderate): Secondary | ICD-10-CM | POA: Diagnosis not present

## 2018-07-07 DIAGNOSIS — Z6824 Body mass index (BMI) 24.0-24.9, adult: Secondary | ICD-10-CM | POA: Diagnosis not present

## 2018-07-07 DIAGNOSIS — Z299 Encounter for prophylactic measures, unspecified: Secondary | ICD-10-CM | POA: Diagnosis not present

## 2018-08-06 DIAGNOSIS — Z6824 Body mass index (BMI) 24.0-24.9, adult: Secondary | ICD-10-CM | POA: Diagnosis not present

## 2018-08-06 DIAGNOSIS — I10 Essential (primary) hypertension: Secondary | ICD-10-CM | POA: Diagnosis not present

## 2018-08-06 DIAGNOSIS — N183 Chronic kidney disease, stage 3 (moderate): Secondary | ICD-10-CM | POA: Diagnosis not present

## 2018-08-06 DIAGNOSIS — I4891 Unspecified atrial fibrillation: Secondary | ICD-10-CM | POA: Diagnosis not present

## 2018-08-06 DIAGNOSIS — Z299 Encounter for prophylactic measures, unspecified: Secondary | ICD-10-CM | POA: Diagnosis not present

## 2018-09-29 DIAGNOSIS — I63522 Cerebral infarction due to unspecified occlusion or stenosis of left anterior cerebral artery: Secondary | ICD-10-CM | POA: Diagnosis not present

## 2018-10-21 DIAGNOSIS — R319 Hematuria, unspecified: Secondary | ICD-10-CM | POA: Diagnosis not present

## 2018-10-21 DIAGNOSIS — I10 Essential (primary) hypertension: Secondary | ICD-10-CM | POA: Diagnosis not present

## 2018-10-21 DIAGNOSIS — Z299 Encounter for prophylactic measures, unspecified: Secondary | ICD-10-CM | POA: Diagnosis not present

## 2018-10-21 DIAGNOSIS — I4891 Unspecified atrial fibrillation: Secondary | ICD-10-CM | POA: Diagnosis not present

## 2018-10-21 DIAGNOSIS — Z6825 Body mass index (BMI) 25.0-25.9, adult: Secondary | ICD-10-CM | POA: Diagnosis not present

## 2018-10-21 DIAGNOSIS — N419 Inflammatory disease of prostate, unspecified: Secondary | ICD-10-CM | POA: Diagnosis not present

## 2018-11-05 DIAGNOSIS — Z6825 Body mass index (BMI) 25.0-25.9, adult: Secondary | ICD-10-CM | POA: Diagnosis not present

## 2018-11-05 DIAGNOSIS — I10 Essential (primary) hypertension: Secondary | ICD-10-CM | POA: Diagnosis not present

## 2018-11-05 DIAGNOSIS — Z299 Encounter for prophylactic measures, unspecified: Secondary | ICD-10-CM | POA: Diagnosis not present

## 2018-11-05 DIAGNOSIS — N419 Inflammatory disease of prostate, unspecified: Secondary | ICD-10-CM | POA: Diagnosis not present

## 2018-11-05 DIAGNOSIS — I4891 Unspecified atrial fibrillation: Secondary | ICD-10-CM | POA: Diagnosis not present

## 2018-11-20 DIAGNOSIS — R319 Hematuria, unspecified: Secondary | ICD-10-CM | POA: Diagnosis not present

## 2018-11-20 DIAGNOSIS — Z6824 Body mass index (BMI) 24.0-24.9, adult: Secondary | ICD-10-CM | POA: Diagnosis not present

## 2018-11-20 DIAGNOSIS — N400 Enlarged prostate without lower urinary tract symptoms: Secondary | ICD-10-CM | POA: Diagnosis not present

## 2018-11-20 DIAGNOSIS — Z299 Encounter for prophylactic measures, unspecified: Secondary | ICD-10-CM | POA: Diagnosis not present

## 2018-11-20 DIAGNOSIS — N183 Chronic kidney disease, stage 3 (moderate): Secondary | ICD-10-CM | POA: Diagnosis not present

## 2018-11-20 DIAGNOSIS — I10 Essential (primary) hypertension: Secondary | ICD-10-CM | POA: Diagnosis not present

## 2018-11-24 DIAGNOSIS — D649 Anemia, unspecified: Secondary | ICD-10-CM | POA: Diagnosis not present

## 2018-11-24 DIAGNOSIS — E7800 Pure hypercholesterolemia, unspecified: Secondary | ICD-10-CM | POA: Diagnosis not present

## 2018-11-24 DIAGNOSIS — Z125 Encounter for screening for malignant neoplasm of prostate: Secondary | ICD-10-CM | POA: Diagnosis not present

## 2018-11-24 DIAGNOSIS — Z1211 Encounter for screening for malignant neoplasm of colon: Secondary | ICD-10-CM | POA: Diagnosis not present

## 2018-11-24 DIAGNOSIS — Z79899 Other long term (current) drug therapy: Secondary | ICD-10-CM | POA: Diagnosis not present

## 2018-11-24 DIAGNOSIS — Z0000 Encounter for general adult medical examination without abnormal findings: Secondary | ICD-10-CM | POA: Diagnosis not present

## 2018-11-24 DIAGNOSIS — R5383 Other fatigue: Secondary | ICD-10-CM | POA: Diagnosis not present

## 2018-11-24 DIAGNOSIS — Z299 Encounter for prophylactic measures, unspecified: Secondary | ICD-10-CM | POA: Diagnosis not present

## 2018-11-24 DIAGNOSIS — Z7189 Other specified counseling: Secondary | ICD-10-CM | POA: Diagnosis not present

## 2018-11-24 DIAGNOSIS — Z6823 Body mass index (BMI) 23.0-23.9, adult: Secondary | ICD-10-CM | POA: Diagnosis not present

## 2018-11-24 DIAGNOSIS — Z1331 Encounter for screening for depression: Secondary | ICD-10-CM | POA: Diagnosis not present

## 2018-11-24 DIAGNOSIS — Z1339 Encounter for screening examination for other mental health and behavioral disorders: Secondary | ICD-10-CM | POA: Diagnosis not present

## 2018-12-15 DIAGNOSIS — I350 Nonrheumatic aortic (valve) stenosis: Secondary | ICD-10-CM | POA: Diagnosis not present

## 2018-12-15 DIAGNOSIS — I4891 Unspecified atrial fibrillation: Secondary | ICD-10-CM | POA: Diagnosis not present

## 2019-01-15 DIAGNOSIS — I4891 Unspecified atrial fibrillation: Secondary | ICD-10-CM | POA: Diagnosis not present

## 2019-01-15 DIAGNOSIS — E7800 Pure hypercholesterolemia, unspecified: Secondary | ICD-10-CM | POA: Diagnosis not present

## 2019-01-15 DIAGNOSIS — Z8673 Personal history of transient ischemic attack (TIA), and cerebral infarction without residual deficits: Secondary | ICD-10-CM | POA: Diagnosis not present

## 2019-01-15 DIAGNOSIS — I10 Essential (primary) hypertension: Secondary | ICD-10-CM | POA: Diagnosis not present

## 2019-01-15 DIAGNOSIS — Z79899 Other long term (current) drug therapy: Secondary | ICD-10-CM | POA: Diagnosis not present

## 2019-01-15 DIAGNOSIS — Z299 Encounter for prophylactic measures, unspecified: Secondary | ICD-10-CM | POA: Diagnosis not present

## 2019-01-15 DIAGNOSIS — E119 Type 2 diabetes mellitus without complications: Secondary | ICD-10-CM | POA: Diagnosis not present

## 2019-01-15 DIAGNOSIS — R1030 Lower abdominal pain, unspecified: Secondary | ICD-10-CM | POA: Diagnosis not present

## 2019-01-15 DIAGNOSIS — N183 Chronic kidney disease, stage 3 unspecified: Secondary | ICD-10-CM | POA: Diagnosis not present

## 2019-01-15 DIAGNOSIS — Z23 Encounter for immunization: Secondary | ICD-10-CM | POA: Diagnosis not present

## 2019-01-15 DIAGNOSIS — R339 Retention of urine, unspecified: Secondary | ICD-10-CM | POA: Diagnosis not present

## 2019-01-15 DIAGNOSIS — R319 Hematuria, unspecified: Secondary | ICD-10-CM | POA: Diagnosis not present

## 2019-01-19 DIAGNOSIS — E119 Type 2 diabetes mellitus without complications: Secondary | ICD-10-CM | POA: Diagnosis not present

## 2019-01-19 DIAGNOSIS — T83098A Other mechanical complication of other indwelling urethral catheter, initial encounter: Secondary | ICD-10-CM | POA: Diagnosis not present

## 2019-01-19 DIAGNOSIS — R319 Hematuria, unspecified: Secondary | ICD-10-CM | POA: Diagnosis not present

## 2019-01-19 DIAGNOSIS — I10 Essential (primary) hypertension: Secondary | ICD-10-CM | POA: Diagnosis not present

## 2019-01-19 DIAGNOSIS — Z7982 Long term (current) use of aspirin: Secondary | ICD-10-CM | POA: Diagnosis not present

## 2019-01-19 DIAGNOSIS — Z79899 Other long term (current) drug therapy: Secondary | ICD-10-CM | POA: Diagnosis not present

## 2019-01-19 DIAGNOSIS — T83091A Other mechanical complication of indwelling urethral catheter, initial encounter: Secondary | ICD-10-CM | POA: Diagnosis not present

## 2019-01-19 DIAGNOSIS — I700 Atherosclerosis of aorta: Secondary | ICD-10-CM | POA: Diagnosis not present

## 2019-01-19 DIAGNOSIS — Z8673 Personal history of transient ischemic attack (TIA), and cerebral infarction without residual deficits: Secondary | ICD-10-CM | POA: Diagnosis not present

## 2019-01-19 DIAGNOSIS — Z87891 Personal history of nicotine dependence: Secondary | ICD-10-CM | POA: Diagnosis not present

## 2019-01-19 DIAGNOSIS — K8020 Calculus of gallbladder without cholecystitis without obstruction: Secondary | ICD-10-CM | POA: Diagnosis not present

## 2019-01-21 DIAGNOSIS — R310 Gross hematuria: Secondary | ICD-10-CM | POA: Diagnosis not present

## 2019-01-21 DIAGNOSIS — R339 Retention of urine, unspecified: Secondary | ICD-10-CM | POA: Diagnosis not present

## 2019-01-21 DIAGNOSIS — Z466 Encounter for fitting and adjustment of urinary device: Secondary | ICD-10-CM | POA: Diagnosis not present

## 2019-02-11 DIAGNOSIS — R310 Gross hematuria: Secondary | ICD-10-CM | POA: Diagnosis not present

## 2019-02-11 DIAGNOSIS — N3289 Other specified disorders of bladder: Secondary | ICD-10-CM | POA: Diagnosis not present

## 2019-02-15 DIAGNOSIS — Z299 Encounter for prophylactic measures, unspecified: Secondary | ICD-10-CM | POA: Diagnosis not present

## 2019-02-15 DIAGNOSIS — I10 Essential (primary) hypertension: Secondary | ICD-10-CM | POA: Diagnosis not present

## 2019-02-15 DIAGNOSIS — N183 Chronic kidney disease, stage 3 unspecified: Secondary | ICD-10-CM | POA: Diagnosis not present

## 2019-02-15 DIAGNOSIS — I4891 Unspecified atrial fibrillation: Secondary | ICD-10-CM | POA: Diagnosis not present

## 2019-02-15 DIAGNOSIS — Z6825 Body mass index (BMI) 25.0-25.9, adult: Secondary | ICD-10-CM | POA: Diagnosis not present

## 2019-03-05 ENCOUNTER — Encounter (INDEPENDENT_AMBULATORY_CARE_PROVIDER_SITE_OTHER): Payer: Medicare Other

## 2019-03-05 ENCOUNTER — Ambulatory Visit (INDEPENDENT_AMBULATORY_CARE_PROVIDER_SITE_OTHER): Payer: Medicare Other | Admitting: Vascular Surgery

## 2019-03-18 ENCOUNTER — Ambulatory Visit (INDEPENDENT_AMBULATORY_CARE_PROVIDER_SITE_OTHER): Payer: Medicare Other

## 2019-03-18 ENCOUNTER — Other Ambulatory Visit: Payer: Self-pay

## 2019-03-18 ENCOUNTER — Ambulatory Visit (INDEPENDENT_AMBULATORY_CARE_PROVIDER_SITE_OTHER): Payer: Medicare Other | Admitting: Nurse Practitioner

## 2019-03-18 DIAGNOSIS — I6523 Occlusion and stenosis of bilateral carotid arteries: Secondary | ICD-10-CM | POA: Diagnosis not present

## 2019-03-22 DIAGNOSIS — Z20828 Contact with and (suspected) exposure to other viral communicable diseases: Secondary | ICD-10-CM | POA: Diagnosis not present

## 2019-03-22 DIAGNOSIS — Z01812 Encounter for preprocedural laboratory examination: Secondary | ICD-10-CM | POA: Diagnosis not present

## 2019-03-22 DIAGNOSIS — E119 Type 2 diabetes mellitus without complications: Secondary | ICD-10-CM | POA: Diagnosis not present

## 2019-03-22 DIAGNOSIS — R310 Gross hematuria: Secondary | ICD-10-CM | POA: Diagnosis not present

## 2019-03-22 DIAGNOSIS — I4811 Longstanding persistent atrial fibrillation: Secondary | ICD-10-CM | POA: Diagnosis not present

## 2019-03-22 DIAGNOSIS — I350 Nonrheumatic aortic (valve) stenosis: Secondary | ICD-10-CM | POA: Diagnosis not present

## 2019-03-22 DIAGNOSIS — I63119 Cerebral infarction due to embolism of unspecified vertebral artery: Secondary | ICD-10-CM | POA: Diagnosis not present

## 2019-03-26 ENCOUNTER — Encounter (INDEPENDENT_AMBULATORY_CARE_PROVIDER_SITE_OTHER): Payer: Self-pay | Admitting: Vascular Surgery

## 2019-04-28 DIAGNOSIS — R310 Gross hematuria: Secondary | ICD-10-CM | POA: Diagnosis not present

## 2019-04-28 DIAGNOSIS — C679 Malignant neoplasm of bladder, unspecified: Secondary | ICD-10-CM | POA: Diagnosis not present

## 2019-04-29 DIAGNOSIS — Z8551 Personal history of malignant neoplasm of bladder: Secondary | ICD-10-CM | POA: Insufficient documentation

## 2019-04-29 DIAGNOSIS — C679 Malignant neoplasm of bladder, unspecified: Secondary | ICD-10-CM | POA: Diagnosis not present

## 2019-05-05 DIAGNOSIS — C679 Malignant neoplasm of bladder, unspecified: Secondary | ICD-10-CM | POA: Diagnosis not present

## 2019-05-06 DIAGNOSIS — Z23 Encounter for immunization: Secondary | ICD-10-CM | POA: Diagnosis not present

## 2019-05-07 DIAGNOSIS — Z87891 Personal history of nicotine dependence: Secondary | ICD-10-CM | POA: Diagnosis not present

## 2019-05-07 DIAGNOSIS — I10 Essential (primary) hypertension: Secondary | ICD-10-CM | POA: Diagnosis not present

## 2019-05-07 DIAGNOSIS — Z6824 Body mass index (BMI) 24.0-24.9, adult: Secondary | ICD-10-CM | POA: Diagnosis not present

## 2019-05-07 DIAGNOSIS — C679 Malignant neoplasm of bladder, unspecified: Secondary | ICD-10-CM | POA: Diagnosis not present

## 2019-05-07 DIAGNOSIS — I4891 Unspecified atrial fibrillation: Secondary | ICD-10-CM | POA: Diagnosis not present

## 2019-05-07 DIAGNOSIS — N183 Chronic kidney disease, stage 3 unspecified: Secondary | ICD-10-CM | POA: Diagnosis not present

## 2019-05-07 DIAGNOSIS — Z299 Encounter for prophylactic measures, unspecified: Secondary | ICD-10-CM | POA: Diagnosis not present

## 2019-05-19 DIAGNOSIS — R5383 Other fatigue: Secondary | ICD-10-CM | POA: Diagnosis not present

## 2019-05-19 DIAGNOSIS — C670 Malignant neoplasm of trigone of bladder: Secondary | ICD-10-CM | POA: Insufficient documentation

## 2019-05-19 DIAGNOSIS — R591 Generalized enlarged lymph nodes: Secondary | ICD-10-CM | POA: Diagnosis not present

## 2019-05-19 DIAGNOSIS — E785 Hyperlipidemia, unspecified: Secondary | ICD-10-CM | POA: Diagnosis not present

## 2019-05-19 DIAGNOSIS — I350 Nonrheumatic aortic (valve) stenosis: Secondary | ICD-10-CM | POA: Diagnosis not present

## 2019-05-20 ENCOUNTER — Other Ambulatory Visit (HOSPITAL_COMMUNITY): Payer: Self-pay | Admitting: Oncology

## 2019-05-20 DIAGNOSIS — C670 Malignant neoplasm of trigone of bladder: Secondary | ICD-10-CM

## 2019-05-28 DIAGNOSIS — R591 Generalized enlarged lymph nodes: Secondary | ICD-10-CM | POA: Diagnosis not present

## 2019-05-28 DIAGNOSIS — Z08 Encounter for follow-up examination after completed treatment for malignant neoplasm: Secondary | ICD-10-CM | POA: Diagnosis not present

## 2019-05-28 DIAGNOSIS — C670 Malignant neoplasm of trigone of bladder: Secondary | ICD-10-CM | POA: Diagnosis not present

## 2019-05-28 DIAGNOSIS — R8289 Other abnormal findings on cytological and histological examination of urine: Secondary | ICD-10-CM | POA: Diagnosis not present

## 2019-05-28 DIAGNOSIS — Z8551 Personal history of malignant neoplasm of bladder: Secondary | ICD-10-CM | POA: Diagnosis not present

## 2019-06-11 DIAGNOSIS — Z23 Encounter for immunization: Secondary | ICD-10-CM | POA: Diagnosis not present

## 2019-06-16 DIAGNOSIS — I7090 Unspecified atherosclerosis: Secondary | ICD-10-CM | POA: Diagnosis not present

## 2019-06-16 DIAGNOSIS — J439 Emphysema, unspecified: Secondary | ICD-10-CM | POA: Diagnosis not present

## 2019-06-16 DIAGNOSIS — I350 Nonrheumatic aortic (valve) stenosis: Secondary | ICD-10-CM | POA: Diagnosis not present

## 2019-06-16 DIAGNOSIS — K439 Ventral hernia without obstruction or gangrene: Secondary | ICD-10-CM | POA: Diagnosis not present

## 2019-06-28 ENCOUNTER — Ambulatory Visit (HOSPITAL_COMMUNITY)
Admission: RE | Admit: 2019-06-28 | Discharge: 2019-06-28 | Disposition: A | Discharge status: Home / Self Care | Payer: Medicare Other | Source: Ambulatory Visit | Attending: Oncology | Admitting: Oncology

## 2019-06-28 ENCOUNTER — Other Ambulatory Visit: Payer: Self-pay

## 2019-06-28 DIAGNOSIS — C679 Malignant neoplasm of bladder, unspecified: Secondary | ICD-10-CM | POA: Diagnosis not present

## 2019-06-28 DIAGNOSIS — C670 Malignant neoplasm of trigone of bladder: Secondary | ICD-10-CM | POA: Insufficient documentation

## 2019-06-28 MED ORDER — FLUDEOXYGLUCOSE F - 18 (FDG) INJECTION
9.6000 | Freq: Once | INTRAVENOUS | Status: AC | PRN
  Administered 2019-06-28: 9.6 via INTRAVENOUS

## 2019-06-30 DIAGNOSIS — C670 Malignant neoplasm of trigone of bladder: Secondary | ICD-10-CM | POA: Diagnosis not present

## 2019-06-30 DIAGNOSIS — Z8551 Personal history of malignant neoplasm of bladder: Secondary | ICD-10-CM | POA: Diagnosis not present

## 2019-06-30 DIAGNOSIS — Z08 Encounter for follow-up examination after completed treatment for malignant neoplasm: Secondary | ICD-10-CM | POA: Diagnosis not present

## 2019-06-30 DIAGNOSIS — I350 Nonrheumatic aortic (valve) stenosis: Secondary | ICD-10-CM | POA: Diagnosis not present

## 2019-06-30 DIAGNOSIS — R591 Generalized enlarged lymph nodes: Secondary | ICD-10-CM | POA: Diagnosis not present

## 2019-07-05 DIAGNOSIS — I083 Combined rheumatic disorders of mitral, aortic and tricuspid valves: Secondary | ICD-10-CM | POA: Diagnosis not present

## 2019-07-05 DIAGNOSIS — Z8673 Personal history of transient ischemic attack (TIA), and cerebral infarction without residual deficits: Secondary | ICD-10-CM | POA: Diagnosis not present

## 2019-07-05 DIAGNOSIS — Z8551 Personal history of malignant neoplasm of bladder: Secondary | ICD-10-CM | POA: Diagnosis not present

## 2019-07-05 DIAGNOSIS — I130 Hypertensive heart and chronic kidney disease with heart failure and stage 1 through stage 4 chronic kidney disease, or unspecified chronic kidney disease: Secondary | ICD-10-CM | POA: Diagnosis not present

## 2019-07-05 DIAGNOSIS — I5022 Chronic systolic (congestive) heart failure: Secondary | ICD-10-CM | POA: Diagnosis not present

## 2019-07-05 DIAGNOSIS — E785 Hyperlipidemia, unspecified: Secondary | ICD-10-CM | POA: Diagnosis not present

## 2019-07-05 DIAGNOSIS — I350 Nonrheumatic aortic (valve) stenosis: Secondary | ICD-10-CM | POA: Diagnosis not present

## 2019-07-05 DIAGNOSIS — I272 Pulmonary hypertension, unspecified: Secondary | ICD-10-CM | POA: Diagnosis not present

## 2019-07-05 DIAGNOSIS — I2510 Atherosclerotic heart disease of native coronary artery without angina pectoris: Secondary | ICD-10-CM | POA: Diagnosis not present

## 2019-07-05 DIAGNOSIS — Z7982 Long term (current) use of aspirin: Secondary | ICD-10-CM | POA: Diagnosis not present

## 2019-07-05 DIAGNOSIS — Z79899 Other long term (current) drug therapy: Secondary | ICD-10-CM | POA: Diagnosis not present

## 2019-07-05 DIAGNOSIS — I4891 Unspecified atrial fibrillation: Secondary | ICD-10-CM | POA: Diagnosis not present

## 2019-07-05 DIAGNOSIS — I482 Chronic atrial fibrillation, unspecified: Secondary | ICD-10-CM | POA: Diagnosis not present

## 2019-07-05 DIAGNOSIS — Z87891 Personal history of nicotine dependence: Secondary | ICD-10-CM | POA: Diagnosis not present

## 2019-07-05 DIAGNOSIS — N189 Chronic kidney disease, unspecified: Secondary | ICD-10-CM | POA: Diagnosis not present

## 2019-07-05 DIAGNOSIS — Z01810 Encounter for preprocedural cardiovascular examination: Secondary | ICD-10-CM | POA: Diagnosis not present

## 2019-07-06 DIAGNOSIS — I350 Nonrheumatic aortic (valve) stenosis: Secondary | ICD-10-CM | POA: Insufficient documentation

## 2019-07-13 DIAGNOSIS — C679 Malignant neoplasm of bladder, unspecified: Secondary | ICD-10-CM | POA: Diagnosis not present

## 2019-07-13 DIAGNOSIS — R310 Gross hematuria: Secondary | ICD-10-CM | POA: Diagnosis not present

## 2019-07-14 DIAGNOSIS — D649 Anemia, unspecified: Secondary | ICD-10-CM | POA: Diagnosis not present

## 2019-07-14 DIAGNOSIS — K2100 Gastro-esophageal reflux disease with esophagitis, without bleeding: Secondary | ICD-10-CM | POA: Diagnosis not present

## 2019-07-19 DIAGNOSIS — I10 Essential (primary) hypertension: Secondary | ICD-10-CM | POA: Diagnosis not present

## 2019-07-19 DIAGNOSIS — I4891 Unspecified atrial fibrillation: Secondary | ICD-10-CM | POA: Diagnosis not present

## 2019-07-19 DIAGNOSIS — Z6824 Body mass index (BMI) 24.0-24.9, adult: Secondary | ICD-10-CM | POA: Diagnosis not present

## 2019-07-19 DIAGNOSIS — Z299 Encounter for prophylactic measures, unspecified: Secondary | ICD-10-CM | POA: Diagnosis not present

## 2019-07-19 DIAGNOSIS — C679 Malignant neoplasm of bladder, unspecified: Secondary | ICD-10-CM | POA: Diagnosis not present

## 2019-07-19 DIAGNOSIS — N183 Chronic kidney disease, stage 3 unspecified: Secondary | ICD-10-CM | POA: Diagnosis not present

## 2019-07-24 DIAGNOSIS — Z20822 Contact with and (suspected) exposure to covid-19: Secondary | ICD-10-CM | POA: Diagnosis not present

## 2019-07-24 DIAGNOSIS — Z01812 Encounter for preprocedural laboratory examination: Secondary | ICD-10-CM | POA: Diagnosis not present

## 2019-07-27 DIAGNOSIS — R0602 Shortness of breath: Secondary | ICD-10-CM | POA: Diagnosis not present

## 2019-07-27 DIAGNOSIS — Z87891 Personal history of nicotine dependence: Secondary | ICD-10-CM | POA: Diagnosis not present

## 2019-07-27 DIAGNOSIS — I959 Hypotension, unspecified: Secondary | ICD-10-CM | POA: Diagnosis not present

## 2019-07-27 DIAGNOSIS — C679 Malignant neoplasm of bladder, unspecified: Secondary | ICD-10-CM | POA: Diagnosis not present

## 2019-07-27 DIAGNOSIS — I70202 Unspecified atherosclerosis of native arteries of extremities, left leg: Secondary | ICD-10-CM | POA: Diagnosis not present

## 2019-07-27 DIAGNOSIS — Z006 Encounter for examination for normal comparison and control in clinical research program: Secondary | ICD-10-CM | POA: Diagnosis not present

## 2019-07-27 DIAGNOSIS — I110 Hypertensive heart disease with heart failure: Secondary | ICD-10-CM | POA: Diagnosis not present

## 2019-07-27 DIAGNOSIS — Z9889 Other specified postprocedural states: Secondary | ICD-10-CM | POA: Diagnosis not present

## 2019-07-27 DIAGNOSIS — D62 Acute posthemorrhagic anemia: Secondary | ICD-10-CM | POA: Diagnosis not present

## 2019-07-27 DIAGNOSIS — G8918 Other acute postprocedural pain: Secondary | ICD-10-CM | POA: Diagnosis not present

## 2019-07-27 DIAGNOSIS — I7777 Dissection of artery of lower extremity: Secondary | ICD-10-CM | POA: Diagnosis not present

## 2019-07-27 DIAGNOSIS — E7800 Pure hypercholesterolemia, unspecified: Secondary | ICD-10-CM | POA: Diagnosis present

## 2019-07-27 DIAGNOSIS — I700 Atherosclerosis of aorta: Secondary | ICD-10-CM | POA: Diagnosis present

## 2019-07-27 DIAGNOSIS — I350 Nonrheumatic aortic (valve) stenosis: Secondary | ICD-10-CM | POA: Diagnosis not present

## 2019-07-27 DIAGNOSIS — I340 Nonrheumatic mitral (valve) insufficiency: Secondary | ICD-10-CM | POA: Diagnosis not present

## 2019-07-27 DIAGNOSIS — I129 Hypertensive chronic kidney disease with stage 1 through stage 4 chronic kidney disease, or unspecified chronic kidney disease: Secondary | ICD-10-CM | POA: Diagnosis not present

## 2019-07-27 DIAGNOSIS — Z7982 Long term (current) use of aspirin: Secondary | ICD-10-CM | POA: Diagnosis not present

## 2019-07-27 DIAGNOSIS — I5023 Acute on chronic systolic (congestive) heart failure: Secondary | ICD-10-CM | POA: Diagnosis present

## 2019-07-27 DIAGNOSIS — I480 Paroxysmal atrial fibrillation: Secondary | ICD-10-CM | POA: Diagnosis not present

## 2019-07-27 DIAGNOSIS — Z8673 Personal history of transient ischemic attack (TIA), and cerebral infarction without residual deficits: Secondary | ICD-10-CM | POA: Diagnosis not present

## 2019-07-27 DIAGNOSIS — R918 Other nonspecific abnormal finding of lung field: Secondary | ICD-10-CM | POA: Diagnosis not present

## 2019-07-27 DIAGNOSIS — D72829 Elevated white blood cell count, unspecified: Secondary | ICD-10-CM | POA: Diagnosis not present

## 2019-07-27 DIAGNOSIS — E785 Hyperlipidemia, unspecified: Secondary | ICD-10-CM | POA: Diagnosis not present

## 2019-07-27 DIAGNOSIS — N189 Chronic kidney disease, unspecified: Secondary | ICD-10-CM | POA: Diagnosis not present

## 2019-07-27 DIAGNOSIS — I517 Cardiomegaly: Secondary | ICD-10-CM | POA: Diagnosis not present

## 2019-07-27 DIAGNOSIS — E1122 Type 2 diabetes mellitus with diabetic chronic kidney disease: Secondary | ICD-10-CM | POA: Diagnosis present

## 2019-07-27 DIAGNOSIS — N183 Chronic kidney disease, stage 3 unspecified: Secondary | ICD-10-CM | POA: Diagnosis not present

## 2019-07-27 DIAGNOSIS — R0989 Other specified symptoms and signs involving the circulatory and respiratory systems: Secondary | ICD-10-CM | POA: Diagnosis not present

## 2019-07-27 DIAGNOSIS — Z20822 Contact with and (suspected) exposure to covid-19: Secondary | ICD-10-CM | POA: Diagnosis present

## 2019-07-27 DIAGNOSIS — I130 Hypertensive heart and chronic kidney disease with heart failure and stage 1 through stage 4 chronic kidney disease, or unspecified chronic kidney disease: Secondary | ICD-10-CM | POA: Diagnosis present

## 2019-07-27 DIAGNOSIS — I447 Left bundle-branch block, unspecified: Secondary | ICD-10-CM | POA: Diagnosis not present

## 2019-07-27 DIAGNOSIS — I739 Peripheral vascular disease, unspecified: Secondary | ICD-10-CM | POA: Diagnosis not present

## 2019-07-27 DIAGNOSIS — Z952 Presence of prosthetic heart valve: Secondary | ICD-10-CM | POA: Diagnosis not present

## 2019-07-27 DIAGNOSIS — Z7902 Long term (current) use of antithrombotics/antiplatelets: Secondary | ICD-10-CM | POA: Diagnosis not present

## 2019-07-27 DIAGNOSIS — I482 Chronic atrial fibrillation, unspecified: Secondary | ICD-10-CM | POA: Diagnosis not present

## 2019-07-27 DIAGNOSIS — N400 Enlarged prostate without lower urinary tract symptoms: Secondary | ICD-10-CM | POA: Diagnosis present

## 2019-08-02 DIAGNOSIS — I4811 Longstanding persistent atrial fibrillation: Secondary | ICD-10-CM | POA: Diagnosis not present

## 2019-08-02 DIAGNOSIS — R310 Gross hematuria: Secondary | ICD-10-CM | POA: Diagnosis not present

## 2019-08-02 DIAGNOSIS — C679 Malignant neoplasm of bladder, unspecified: Secondary | ICD-10-CM | POA: Diagnosis not present

## 2019-08-02 DIAGNOSIS — D500 Iron deficiency anemia secondary to blood loss (chronic): Secondary | ICD-10-CM | POA: Diagnosis not present

## 2019-08-02 DIAGNOSIS — Z952 Presence of prosthetic heart valve: Secondary | ICD-10-CM | POA: Diagnosis not present

## 2019-08-03 DIAGNOSIS — D649 Anemia, unspecified: Secondary | ICD-10-CM | POA: Insufficient documentation

## 2019-08-06 DIAGNOSIS — D62 Acute posthemorrhagic anemia: Secondary | ICD-10-CM | POA: Diagnosis not present

## 2019-08-06 DIAGNOSIS — C670 Malignant neoplasm of trigone of bladder: Secondary | ICD-10-CM | POA: Diagnosis not present

## 2019-08-06 DIAGNOSIS — Z952 Presence of prosthetic heart valve: Secondary | ICD-10-CM | POA: Diagnosis not present

## 2019-08-06 DIAGNOSIS — R58 Hemorrhage, not elsewhere classified: Secondary | ICD-10-CM | POA: Diagnosis not present

## 2019-08-10 DIAGNOSIS — R58 Hemorrhage, not elsewhere classified: Secondary | ICD-10-CM | POA: Insufficient documentation

## 2019-08-11 DIAGNOSIS — D500 Iron deficiency anemia secondary to blood loss (chronic): Secondary | ICD-10-CM | POA: Diagnosis not present

## 2019-08-11 DIAGNOSIS — I350 Nonrheumatic aortic (valve) stenosis: Secondary | ICD-10-CM | POA: Diagnosis not present

## 2019-08-11 DIAGNOSIS — Z952 Presence of prosthetic heart valve: Secondary | ICD-10-CM | POA: Diagnosis not present

## 2019-08-11 DIAGNOSIS — I10 Essential (primary) hypertension: Secondary | ICD-10-CM | POA: Diagnosis not present

## 2019-08-11 DIAGNOSIS — Z299 Encounter for prophylactic measures, unspecified: Secondary | ICD-10-CM | POA: Diagnosis not present

## 2019-08-11 DIAGNOSIS — I4891 Unspecified atrial fibrillation: Secondary | ICD-10-CM | POA: Diagnosis not present

## 2019-08-16 DIAGNOSIS — Z01812 Encounter for preprocedural laboratory examination: Secondary | ICD-10-CM | POA: Diagnosis not present

## 2019-08-16 DIAGNOSIS — C679 Malignant neoplasm of bladder, unspecified: Secondary | ICD-10-CM | POA: Diagnosis not present

## 2019-08-16 DIAGNOSIS — Z20822 Contact with and (suspected) exposure to covid-19: Secondary | ICD-10-CM | POA: Diagnosis not present

## 2019-08-18 DIAGNOSIS — D500 Iron deficiency anemia secondary to blood loss (chronic): Secondary | ICD-10-CM | POA: Diagnosis not present

## 2019-08-23 DIAGNOSIS — C678 Malignant neoplasm of overlapping sites of bladder: Secondary | ICD-10-CM | POA: Diagnosis not present

## 2019-08-23 DIAGNOSIS — C679 Malignant neoplasm of bladder, unspecified: Secondary | ICD-10-CM | POA: Diagnosis not present

## 2019-08-23 DIAGNOSIS — R310 Gross hematuria: Secondary | ICD-10-CM | POA: Diagnosis not present

## 2019-08-23 DIAGNOSIS — Z8551 Personal history of malignant neoplasm of bladder: Secondary | ICD-10-CM | POA: Diagnosis not present

## 2019-08-23 DIAGNOSIS — D414 Neoplasm of uncertain behavior of bladder: Secondary | ICD-10-CM | POA: Diagnosis not present

## 2019-08-24 DIAGNOSIS — R319 Hematuria, unspecified: Secondary | ICD-10-CM | POA: Diagnosis not present

## 2019-08-24 DIAGNOSIS — R310 Gross hematuria: Secondary | ICD-10-CM | POA: Diagnosis not present

## 2019-08-24 DIAGNOSIS — J690 Pneumonitis due to inhalation of food and vomit: Secondary | ICD-10-CM | POA: Diagnosis present

## 2019-08-24 DIAGNOSIS — Z743 Need for continuous supervision: Secondary | ICD-10-CM | POA: Diagnosis not present

## 2019-08-24 DIAGNOSIS — D62 Acute posthemorrhagic anemia: Secondary | ICD-10-CM | POA: Diagnosis not present

## 2019-08-24 DIAGNOSIS — I499 Cardiac arrhythmia, unspecified: Secondary | ICD-10-CM | POA: Diagnosis not present

## 2019-08-24 DIAGNOSIS — Z952 Presence of prosthetic heart valve: Secondary | ICD-10-CM | POA: Diagnosis not present

## 2019-08-24 DIAGNOSIS — Z7902 Long term (current) use of antithrombotics/antiplatelets: Secondary | ICD-10-CM | POA: Diagnosis not present

## 2019-08-24 DIAGNOSIS — R000 Tachycardia, unspecified: Secondary | ICD-10-CM | POA: Diagnosis not present

## 2019-08-24 DIAGNOSIS — J9692 Respiratory failure, unspecified with hypercapnia: Secondary | ICD-10-CM | POA: Diagnosis present

## 2019-08-24 DIAGNOSIS — I10 Essential (primary) hypertension: Secondary | ICD-10-CM | POA: Diagnosis not present

## 2019-08-24 DIAGNOSIS — N3081 Other cystitis with hematuria: Secondary | ICD-10-CM | POA: Diagnosis not present

## 2019-08-24 DIAGNOSIS — M6281 Muscle weakness (generalized): Secondary | ICD-10-CM | POA: Diagnosis not present

## 2019-08-24 DIAGNOSIS — Z7901 Long term (current) use of anticoagulants: Secondary | ICD-10-CM | POA: Diagnosis not present

## 2019-08-24 DIAGNOSIS — E785 Hyperlipidemia, unspecified: Secondary | ICD-10-CM | POA: Diagnosis present

## 2019-08-24 DIAGNOSIS — Z87891 Personal history of nicotine dependence: Secondary | ICD-10-CM | POA: Diagnosis not present

## 2019-08-24 DIAGNOSIS — R279 Unspecified lack of coordination: Secondary | ICD-10-CM | POA: Diagnosis not present

## 2019-08-24 DIAGNOSIS — R1312 Dysphagia, oropharyngeal phase: Secondary | ICD-10-CM | POA: Diagnosis not present

## 2019-08-24 DIAGNOSIS — C679 Malignant neoplasm of bladder, unspecified: Secondary | ICD-10-CM | POA: Diagnosis not present

## 2019-08-24 DIAGNOSIS — Z7982 Long term (current) use of aspirin: Secondary | ICD-10-CM | POA: Diagnosis not present

## 2019-08-24 DIAGNOSIS — Z20822 Contact with and (suspected) exposure to covid-19: Secondary | ICD-10-CM | POA: Diagnosis present

## 2019-08-24 DIAGNOSIS — R41841 Cognitive communication deficit: Secondary | ICD-10-CM | POA: Diagnosis not present

## 2019-08-24 DIAGNOSIS — R531 Weakness: Secondary | ICD-10-CM | POA: Diagnosis not present

## 2019-08-24 DIAGNOSIS — Z4659 Encounter for fitting and adjustment of other gastrointestinal appliance and device: Secondary | ICD-10-CM | POA: Diagnosis not present

## 2019-08-24 DIAGNOSIS — R579 Shock, unspecified: Secondary | ICD-10-CM | POA: Diagnosis present

## 2019-08-24 DIAGNOSIS — R638 Other symptoms and signs concerning food and fluid intake: Secondary | ICD-10-CM | POA: Diagnosis not present

## 2019-08-24 DIAGNOSIS — I6521 Occlusion and stenosis of right carotid artery: Secondary | ICD-10-CM | POA: Diagnosis present

## 2019-08-24 DIAGNOSIS — E119 Type 2 diabetes mellitus without complications: Secondary | ICD-10-CM | POA: Diagnosis not present

## 2019-08-24 DIAGNOSIS — N3289 Other specified disorders of bladder: Secondary | ICD-10-CM | POA: Diagnosis not present

## 2019-08-24 DIAGNOSIS — N179 Acute kidney failure, unspecified: Secondary | ICD-10-CM | POA: Diagnosis present

## 2019-08-24 DIAGNOSIS — J80 Acute respiratory distress syndrome: Secondary | ICD-10-CM | POA: Diagnosis not present

## 2019-08-24 DIAGNOSIS — D649 Anemia, unspecified: Secondary | ICD-10-CM | POA: Diagnosis not present

## 2019-08-24 DIAGNOSIS — I4891 Unspecified atrial fibrillation: Secondary | ICD-10-CM | POA: Diagnosis not present

## 2019-08-24 DIAGNOSIS — R2689 Other abnormalities of gait and mobility: Secondary | ICD-10-CM | POA: Diagnosis not present

## 2019-08-24 DIAGNOSIS — I4811 Longstanding persistent atrial fibrillation: Secondary | ICD-10-CM | POA: Diagnosis present

## 2019-08-24 DIAGNOSIS — I482 Chronic atrial fibrillation, unspecified: Secondary | ICD-10-CM | POA: Diagnosis not present

## 2019-08-24 DIAGNOSIS — Z8673 Personal history of transient ischemic attack (TIA), and cerebral infarction without residual deficits: Secondary | ICD-10-CM | POA: Diagnosis not present

## 2019-08-24 DIAGNOSIS — R918 Other nonspecific abnormal finding of lung field: Secondary | ICD-10-CM | POA: Diagnosis not present

## 2019-09-01 DIAGNOSIS — R41841 Cognitive communication deficit: Secondary | ICD-10-CM | POA: Diagnosis not present

## 2019-09-01 DIAGNOSIS — R310 Gross hematuria: Secondary | ICD-10-CM | POA: Diagnosis not present

## 2019-09-01 DIAGNOSIS — I482 Chronic atrial fibrillation, unspecified: Secondary | ICD-10-CM | POA: Diagnosis not present

## 2019-09-01 DIAGNOSIS — M6281 Muscle weakness (generalized): Secondary | ICD-10-CM | POA: Diagnosis not present

## 2019-09-01 DIAGNOSIS — E119 Type 2 diabetes mellitus without complications: Secondary | ICD-10-CM | POA: Diagnosis not present

## 2019-09-01 DIAGNOSIS — R2689 Other abnormalities of gait and mobility: Secondary | ICD-10-CM | POA: Diagnosis not present

## 2019-09-01 DIAGNOSIS — R279 Unspecified lack of coordination: Secondary | ICD-10-CM | POA: Diagnosis not present

## 2019-09-01 DIAGNOSIS — E785 Hyperlipidemia, unspecified: Secondary | ICD-10-CM | POA: Diagnosis not present

## 2019-09-01 DIAGNOSIS — D62 Acute posthemorrhagic anemia: Secondary | ICD-10-CM | POA: Diagnosis not present

## 2019-09-01 DIAGNOSIS — R531 Weakness: Secondary | ICD-10-CM | POA: Diagnosis not present

## 2019-09-01 DIAGNOSIS — I350 Nonrheumatic aortic (valve) stenosis: Secondary | ICD-10-CM | POA: Diagnosis not present

## 2019-09-01 DIAGNOSIS — I10 Essential (primary) hypertension: Secondary | ICD-10-CM | POA: Diagnosis not present

## 2019-09-01 DIAGNOSIS — C679 Malignant neoplasm of bladder, unspecified: Secondary | ICD-10-CM | POA: Diagnosis not present

## 2019-09-01 DIAGNOSIS — R0602 Shortness of breath: Secondary | ICD-10-CM | POA: Diagnosis not present

## 2019-09-01 DIAGNOSIS — R1312 Dysphagia, oropharyngeal phase: Secondary | ICD-10-CM | POA: Diagnosis not present

## 2019-09-01 DIAGNOSIS — Z87891 Personal history of nicotine dependence: Secondary | ICD-10-CM | POA: Diagnosis not present

## 2019-09-01 DIAGNOSIS — I499 Cardiac arrhythmia, unspecified: Secondary | ICD-10-CM | POA: Diagnosis not present

## 2019-09-01 DIAGNOSIS — I4891 Unspecified atrial fibrillation: Secondary | ICD-10-CM | POA: Diagnosis not present

## 2019-09-01 DIAGNOSIS — Z743 Need for continuous supervision: Secondary | ICD-10-CM | POA: Diagnosis not present

## 2019-09-10 ENCOUNTER — Other Ambulatory Visit: Payer: Self-pay | Admitting: *Deleted

## 2019-09-10 DIAGNOSIS — I482 Chronic atrial fibrillation, unspecified: Secondary | ICD-10-CM | POA: Diagnosis not present

## 2019-09-10 DIAGNOSIS — Z87891 Personal history of nicotine dependence: Secondary | ICD-10-CM | POA: Diagnosis not present

## 2019-09-10 DIAGNOSIS — R0602 Shortness of breath: Secondary | ICD-10-CM | POA: Diagnosis not present

## 2019-09-10 DIAGNOSIS — I350 Nonrheumatic aortic (valve) stenosis: Secondary | ICD-10-CM | POA: Diagnosis not present

## 2019-09-10 DIAGNOSIS — E785 Hyperlipidemia, unspecified: Secondary | ICD-10-CM | POA: Diagnosis not present

## 2019-09-10 DIAGNOSIS — I10 Essential (primary) hypertension: Secondary | ICD-10-CM | POA: Diagnosis not present

## 2019-09-10 NOTE — Patient Outreach (Signed)
Late entry for 09/09/19  Screened for potential [facility] Care Management needs as a benefit of  [employer].  Mr. Laurenzi is receiving skilled therapy at [facility].  Writer attended telephonic interdisciplinary team meeting to assess for disposition needs and transition plan for resident.   Mr. Kirkman was independent prior. Lives with wife. Anticipated transition plan is to return home with spouse.  Will continue to follow for transition plans and for potential [facility] Care Management needs while member resides in SNF.    Marthenia Rolling, MSN-Ed, RN,BSN [place] Acute Care Coordinator [phone] [facility]) [phone]  (Toll free office)

## 2019-09-21 DIAGNOSIS — C679 Malignant neoplasm of bladder, unspecified: Secondary | ICD-10-CM | POA: Diagnosis not present

## 2019-09-23 ENCOUNTER — Other Ambulatory Visit: Payer: Self-pay | Admitting: *Deleted

## 2019-09-23 NOTE — Patient Outreach (Signed)
Member screened for potential [facility] Care Management needs as a benefit of [employer].  Bryan Christian is receiving skilled therapy at [facility]. Spoke with [facility] RN who indicates member will transition to home on tomorrow 09/24/19.   Telephone call to [facility] wife [name] [phone]. No answer. HIPAA compliant voicemail message left requesting return call.   Will follow up with [facility]  SW to see if [place] Management services are needed.   Marthenia Rolling, MSN-Ed, RN,BSN [place] Acute Care Coordinator [phone] [facility]) [phone]  (Toll free office)

## 2019-09-23 NOTE — Patient Outreach (Signed)
[place] Coordinator follow up  Collaboration with [place].  Mr. Kerlin will have [place] for PT/OT/ST and rollator, bedside commode and wheelchair.  Telephone call also received from Javohn Basey (wife) [phone]. Patient identifiers confirmed.  Macie Burows confirms member will come home tomorrow 09/24/19. Explained [place] Management services. Mrs. Jake is agreeable. Explained [place] Management services will not interfere or replace home health.   Mrs. Briggs states she welcomes [place] Management follow up as member has had multiple medical concerns as of late. History of bladder cancer, recent aortic valve replacement with complications after procedure such as blood loss, aspiration. Mrs. Batiz states member continues to have positive attitude despite his difficulties. His procedure was at [facility] in May.  Discussed Probation officer will make referral for [place].   Will send [place] Management and writer contact information via email.   Marthenia Rolling, MSN-Ed, RN,BSN [place] Acute Care Coordinator [phone] [facility]) [phone]  (Toll free office)

## 2019-09-25 DIAGNOSIS — Z87891 Personal history of nicotine dependence: Secondary | ICD-10-CM | POA: Diagnosis not present

## 2019-09-25 DIAGNOSIS — Z7902 Long term (current) use of antithrombotics/antiplatelets: Secondary | ICD-10-CM | POA: Diagnosis not present

## 2019-09-25 DIAGNOSIS — D631 Anemia in chronic kidney disease: Secondary | ICD-10-CM | POA: Diagnosis not present

## 2019-09-25 DIAGNOSIS — I480 Paroxysmal atrial fibrillation: Secondary | ICD-10-CM | POA: Diagnosis not present

## 2019-09-25 DIAGNOSIS — K2100 Gastro-esophageal reflux disease with esophagitis, without bleeding: Secondary | ICD-10-CM | POA: Diagnosis not present

## 2019-09-25 DIAGNOSIS — Z8673 Personal history of transient ischemic attack (TIA), and cerebral infarction without residual deficits: Secondary | ICD-10-CM | POA: Diagnosis not present

## 2019-09-25 DIAGNOSIS — R011 Cardiac murmur, unspecified: Secondary | ICD-10-CM | POA: Diagnosis not present

## 2019-09-25 DIAGNOSIS — E7800 Pure hypercholesterolemia, unspecified: Secondary | ICD-10-CM | POA: Diagnosis not present

## 2019-09-25 DIAGNOSIS — N183 Chronic kidney disease, stage 3 unspecified: Secondary | ICD-10-CM | POA: Diagnosis not present

## 2019-09-25 DIAGNOSIS — Z952 Presence of prosthetic heart valve: Secondary | ICD-10-CM | POA: Diagnosis not present

## 2019-09-25 DIAGNOSIS — R310 Gross hematuria: Secondary | ICD-10-CM | POA: Diagnosis not present

## 2019-09-25 DIAGNOSIS — I350 Nonrheumatic aortic (valve) stenosis: Secondary | ICD-10-CM | POA: Diagnosis not present

## 2019-09-25 DIAGNOSIS — Z7982 Long term (current) use of aspirin: Secondary | ICD-10-CM | POA: Diagnosis not present

## 2019-09-25 DIAGNOSIS — Z483 Aftercare following surgery for neoplasm: Secondary | ICD-10-CM | POA: Diagnosis not present

## 2019-09-25 DIAGNOSIS — I129 Hypertensive chronic kidney disease with stage 1 through stage 4 chronic kidney disease, or unspecified chronic kidney disease: Secondary | ICD-10-CM | POA: Diagnosis not present

## 2019-09-25 DIAGNOSIS — C679 Malignant neoplasm of bladder, unspecified: Secondary | ICD-10-CM | POA: Diagnosis not present

## 2019-09-25 DIAGNOSIS — I6521 Occlusion and stenosis of right carotid artery: Secondary | ICD-10-CM | POA: Diagnosis not present

## 2019-09-25 DIAGNOSIS — I771 Stricture of artery: Secondary | ICD-10-CM | POA: Diagnosis not present

## 2019-09-25 DIAGNOSIS — Z9079 Acquired absence of other genital organ(s): Secondary | ICD-10-CM | POA: Diagnosis not present

## 2019-09-27 DIAGNOSIS — Z483 Aftercare following surgery for neoplasm: Secondary | ICD-10-CM | POA: Diagnosis not present

## 2019-09-27 DIAGNOSIS — I480 Paroxysmal atrial fibrillation: Secondary | ICD-10-CM | POA: Diagnosis not present

## 2019-09-27 DIAGNOSIS — C670 Malignant neoplasm of trigone of bladder: Secondary | ICD-10-CM | POA: Diagnosis not present

## 2019-09-27 DIAGNOSIS — I129 Hypertensive chronic kidney disease with stage 1 through stage 4 chronic kidney disease, or unspecified chronic kidney disease: Secondary | ICD-10-CM | POA: Diagnosis not present

## 2019-09-27 DIAGNOSIS — C679 Malignant neoplasm of bladder, unspecified: Secondary | ICD-10-CM | POA: Diagnosis not present

## 2019-09-27 DIAGNOSIS — N183 Chronic kidney disease, stage 3 unspecified: Secondary | ICD-10-CM | POA: Diagnosis not present

## 2019-09-27 DIAGNOSIS — D500 Iron deficiency anemia secondary to blood loss (chronic): Secondary | ICD-10-CM | POA: Diagnosis not present

## 2019-09-27 DIAGNOSIS — R310 Gross hematuria: Secondary | ICD-10-CM | POA: Diagnosis not present

## 2019-09-29 DIAGNOSIS — R310 Gross hematuria: Secondary | ICD-10-CM | POA: Diagnosis not present

## 2019-09-29 DIAGNOSIS — Z483 Aftercare following surgery for neoplasm: Secondary | ICD-10-CM | POA: Diagnosis not present

## 2019-09-29 DIAGNOSIS — I129 Hypertensive chronic kidney disease with stage 1 through stage 4 chronic kidney disease, or unspecified chronic kidney disease: Secondary | ICD-10-CM | POA: Diagnosis not present

## 2019-09-29 DIAGNOSIS — I480 Paroxysmal atrial fibrillation: Secondary | ICD-10-CM | POA: Diagnosis not present

## 2019-09-29 DIAGNOSIS — C679 Malignant neoplasm of bladder, unspecified: Secondary | ICD-10-CM | POA: Diagnosis not present

## 2019-09-29 DIAGNOSIS — N183 Chronic kidney disease, stage 3 unspecified: Secondary | ICD-10-CM | POA: Diagnosis not present

## 2019-09-30 DIAGNOSIS — Z483 Aftercare following surgery for neoplasm: Secondary | ICD-10-CM | POA: Diagnosis not present

## 2019-09-30 DIAGNOSIS — I129 Hypertensive chronic kidney disease with stage 1 through stage 4 chronic kidney disease, or unspecified chronic kidney disease: Secondary | ICD-10-CM | POA: Diagnosis not present

## 2019-09-30 DIAGNOSIS — I480 Paroxysmal atrial fibrillation: Secondary | ICD-10-CM | POA: Diagnosis not present

## 2019-09-30 DIAGNOSIS — C679 Malignant neoplasm of bladder, unspecified: Secondary | ICD-10-CM | POA: Diagnosis not present

## 2019-09-30 DIAGNOSIS — R310 Gross hematuria: Secondary | ICD-10-CM | POA: Diagnosis not present

## 2019-09-30 DIAGNOSIS — N183 Chronic kidney disease, stage 3 unspecified: Secondary | ICD-10-CM | POA: Diagnosis not present

## 2019-10-01 ENCOUNTER — Encounter: Payer: Self-pay | Admitting: *Deleted

## 2019-10-01 ENCOUNTER — Other Ambulatory Visit: Payer: Self-pay | Admitting: *Deleted

## 2019-10-01 DIAGNOSIS — R310 Gross hematuria: Secondary | ICD-10-CM | POA: Diagnosis not present

## 2019-10-01 DIAGNOSIS — C679 Malignant neoplasm of bladder, unspecified: Secondary | ICD-10-CM | POA: Diagnosis not present

## 2019-10-01 DIAGNOSIS — I480 Paroxysmal atrial fibrillation: Secondary | ICD-10-CM | POA: Diagnosis not present

## 2019-10-01 DIAGNOSIS — Z483 Aftercare following surgery for neoplasm: Secondary | ICD-10-CM | POA: Diagnosis not present

## 2019-10-01 DIAGNOSIS — I129 Hypertensive chronic kidney disease with stage 1 through stage 4 chronic kidney disease, or unspecified chronic kidney disease: Secondary | ICD-10-CM | POA: Diagnosis not present

## 2019-10-01 DIAGNOSIS — N183 Chronic kidney disease, stage 3 unspecified: Secondary | ICD-10-CM | POA: Diagnosis not present

## 2019-10-01 NOTE — Patient Outreach (Addendum)
[facility]) Care Management  10/01/2019  Bryan Christian 1936-01-23 [id number]   Subjective: Telephone call to patient's home / mobile number, times 3, spoke with patient, and HIPAA verified.  Discussed [facility] Care Management Medicare Post Acute Care Coordinator transition of care  follow up, patient voiced understanding, and is in agreement to follow up.   Patient states he is doing fairly well, improving daily, uses rollator, uses cane, has home health services through [facility], and services are going well.   States he has the following services: physical therapy, occupational therapy, and speech therapy.  States he had a follow up visits with primary MD today, with oncologist on 09/29/2019, with urologist on 09/21/2019, with cardiologist on 09/10/2019,  and appointments went well.  States he has the following scheduled appointments, with oncologist on 10/14/2019, and with radiologist oncologist on 10/20/2019, appointments will determine treatment plan, will discuss next steps.  Patient gave verbal consent her this RNCM to speak with patient's wife Bryan Christian, as needed regarding his healthcare needs.  Patient states he is able to manage self care and has assistance as needed.  Patient states he is aware of signs/ symptoms to report, how to reach provider if needed after hours, when to go to ED, and / or call 911. Patient voices understanding of medical diagnosis and treatment plan.  Discussed Advanced Directives, advised of [place] Management  Advanced Directives packet resource, patient voices understanding, and  In agreement to be sent packet at this time.   States he is also planning to discuss his desire for no code status with family and providers.  RNCM advised of [employer] document scanning  system for Advanced Directives documents via local providers office or hospitals, patient  voices understanding, and he will discuss accessing through patient's primary provider if needed in the  future.  States he is accessing his Medicare benefits as needed via member services number on back of card.   Patient states he researches any educational topics via google as needed, is aware of his medical conditions, and bladder cancer prognosis. Patient states he does not have any education material, transition of care, care coordination, disease management, disease monitoring, transportation, community resource, or pharmacy needs at this time.  States he is very appreciative of the follow up, is in agreement  to receive 1 additional follow up call to assess for further CM needs, requested follow up call after upcoming MD follow up visits.      Objective: Per KPN (Knowledge Performance Now, point of care tool) and chart review, patient hospitalized 08/23/2019 - 09/01/2019 for  Malignant neoplasm of urinary bladder, status post CYSTOURETHROSCOPY, CYSTOSCOPY,  with TURBT ( TRANSURETHRAL RESECTION OF BLADDER TUMOR ) on 08/23/2019 at [facility].  Patient also has a history of stroke, hypertension, diabetes, Hypercholesteremia, and atrial fibrillation.       Assessment: Received Medicare [place] Management Post Acute Care Coordinator referral on 09/24/2019.   Referral source: Marthenia Rolling.   Referral reason: Please assign to [place] for care coordination and complex case management. To transition from [place] on Friday, 09/24/19. Will have [facility] ([place]) . Spoke with wife who is agreeable to [facility] CM services.   Transition of care follow up completed  and will follow up assess for CM needs.       Plan: RNCM will send patient successful outreach letter, [facility] pamphlet, magnet, and  Advanced Directives packet, per patient's request. RNCM  will call patient or patient's wife for telephone outreach attempt, within 30 business days, assess for CM needs, and follow up.     Bryan Christian H. Annia Friendly, BSN, [place] Management [facility] Telephonic  CM Phone: [phone] Fax: [phone]

## 2019-10-04 DIAGNOSIS — I480 Paroxysmal atrial fibrillation: Secondary | ICD-10-CM | POA: Diagnosis not present

## 2019-10-04 DIAGNOSIS — N183 Chronic kidney disease, stage 3 unspecified: Secondary | ICD-10-CM | POA: Diagnosis not present

## 2019-10-04 DIAGNOSIS — I129 Hypertensive chronic kidney disease with stage 1 through stage 4 chronic kidney disease, or unspecified chronic kidney disease: Secondary | ICD-10-CM | POA: Diagnosis not present

## 2019-10-04 DIAGNOSIS — R310 Gross hematuria: Secondary | ICD-10-CM | POA: Diagnosis not present

## 2019-10-04 DIAGNOSIS — C679 Malignant neoplasm of bladder, unspecified: Secondary | ICD-10-CM | POA: Diagnosis not present

## 2019-10-04 DIAGNOSIS — Z483 Aftercare following surgery for neoplasm: Secondary | ICD-10-CM | POA: Diagnosis not present

## 2019-10-06 DIAGNOSIS — Z483 Aftercare following surgery for neoplasm: Secondary | ICD-10-CM | POA: Diagnosis not present

## 2019-10-06 DIAGNOSIS — R310 Gross hematuria: Secondary | ICD-10-CM | POA: Diagnosis not present

## 2019-10-06 DIAGNOSIS — I480 Paroxysmal atrial fibrillation: Secondary | ICD-10-CM | POA: Diagnosis not present

## 2019-10-06 DIAGNOSIS — N183 Chronic kidney disease, stage 3 unspecified: Secondary | ICD-10-CM | POA: Diagnosis not present

## 2019-10-06 DIAGNOSIS — C679 Malignant neoplasm of bladder, unspecified: Secondary | ICD-10-CM | POA: Diagnosis not present

## 2019-10-06 DIAGNOSIS — I129 Hypertensive chronic kidney disease with stage 1 through stage 4 chronic kidney disease, or unspecified chronic kidney disease: Secondary | ICD-10-CM | POA: Diagnosis not present

## 2019-10-07 DIAGNOSIS — I129 Hypertensive chronic kidney disease with stage 1 through stage 4 chronic kidney disease, or unspecified chronic kidney disease: Secondary | ICD-10-CM | POA: Diagnosis not present

## 2019-10-07 DIAGNOSIS — N183 Chronic kidney disease, stage 3 unspecified: Secondary | ICD-10-CM | POA: Diagnosis not present

## 2019-10-07 DIAGNOSIS — R310 Gross hematuria: Secondary | ICD-10-CM | POA: Diagnosis not present

## 2019-10-07 DIAGNOSIS — Z483 Aftercare following surgery for neoplasm: Secondary | ICD-10-CM | POA: Diagnosis not present

## 2019-10-07 DIAGNOSIS — C679 Malignant neoplasm of bladder, unspecified: Secondary | ICD-10-CM | POA: Diagnosis not present

## 2019-10-07 DIAGNOSIS — I480 Paroxysmal atrial fibrillation: Secondary | ICD-10-CM | POA: Diagnosis not present

## 2019-10-08 DIAGNOSIS — R310 Gross hematuria: Secondary | ICD-10-CM | POA: Diagnosis not present

## 2019-10-08 DIAGNOSIS — C679 Malignant neoplasm of bladder, unspecified: Secondary | ICD-10-CM | POA: Diagnosis not present

## 2019-10-08 DIAGNOSIS — Z483 Aftercare following surgery for neoplasm: Secondary | ICD-10-CM | POA: Diagnosis not present

## 2019-10-08 DIAGNOSIS — I129 Hypertensive chronic kidney disease with stage 1 through stage 4 chronic kidney disease, or unspecified chronic kidney disease: Secondary | ICD-10-CM | POA: Diagnosis not present

## 2019-10-08 DIAGNOSIS — I480 Paroxysmal atrial fibrillation: Secondary | ICD-10-CM | POA: Diagnosis not present

## 2019-10-08 DIAGNOSIS — N183 Chronic kidney disease, stage 3 unspecified: Secondary | ICD-10-CM | POA: Diagnosis not present

## 2019-10-11 DIAGNOSIS — I129 Hypertensive chronic kidney disease with stage 1 through stage 4 chronic kidney disease, or unspecified chronic kidney disease: Secondary | ICD-10-CM | POA: Diagnosis not present

## 2019-10-11 DIAGNOSIS — R310 Gross hematuria: Secondary | ICD-10-CM | POA: Diagnosis not present

## 2019-10-11 DIAGNOSIS — C679 Malignant neoplasm of bladder, unspecified: Secondary | ICD-10-CM | POA: Diagnosis not present

## 2019-10-11 DIAGNOSIS — N183 Chronic kidney disease, stage 3 unspecified: Secondary | ICD-10-CM | POA: Diagnosis not present

## 2019-10-11 DIAGNOSIS — I480 Paroxysmal atrial fibrillation: Secondary | ICD-10-CM | POA: Diagnosis not present

## 2019-10-11 DIAGNOSIS — Z483 Aftercare following surgery for neoplasm: Secondary | ICD-10-CM | POA: Diagnosis not present

## 2019-10-13 DIAGNOSIS — N183 Chronic kidney disease, stage 3 unspecified: Secondary | ICD-10-CM | POA: Diagnosis not present

## 2019-10-13 DIAGNOSIS — I129 Hypertensive chronic kidney disease with stage 1 through stage 4 chronic kidney disease, or unspecified chronic kidney disease: Secondary | ICD-10-CM | POA: Diagnosis not present

## 2019-10-13 DIAGNOSIS — I10 Essential (primary) hypertension: Secondary | ICD-10-CM | POA: Diagnosis not present

## 2019-10-13 DIAGNOSIS — C679 Malignant neoplasm of bladder, unspecified: Secondary | ICD-10-CM | POA: Diagnosis not present

## 2019-10-13 DIAGNOSIS — K2100 Gastro-esophageal reflux disease with esophagitis, without bleeding: Secondary | ICD-10-CM | POA: Diagnosis not present

## 2019-10-13 DIAGNOSIS — Z483 Aftercare following surgery for neoplasm: Secondary | ICD-10-CM | POA: Diagnosis not present

## 2019-10-13 DIAGNOSIS — R310 Gross hematuria: Secondary | ICD-10-CM | POA: Diagnosis not present

## 2019-10-13 DIAGNOSIS — I480 Paroxysmal atrial fibrillation: Secondary | ICD-10-CM | POA: Diagnosis not present

## 2019-10-14 DIAGNOSIS — D500 Iron deficiency anemia secondary to blood loss (chronic): Secondary | ICD-10-CM | POA: Diagnosis not present

## 2019-10-15 ENCOUNTER — Other Ambulatory Visit (HOSPITAL_COMMUNITY): Payer: Self-pay | Admitting: Specialist

## 2019-10-15 DIAGNOSIS — R1319 Other dysphagia: Secondary | ICD-10-CM

## 2019-10-15 DIAGNOSIS — C679 Malignant neoplasm of bladder, unspecified: Secondary | ICD-10-CM

## 2019-10-15 DIAGNOSIS — I639 Cerebral infarction, unspecified: Secondary | ICD-10-CM

## 2019-10-19 DIAGNOSIS — I129 Hypertensive chronic kidney disease with stage 1 through stage 4 chronic kidney disease, or unspecified chronic kidney disease: Secondary | ICD-10-CM | POA: Diagnosis not present

## 2019-10-19 DIAGNOSIS — N183 Chronic kidney disease, stage 3 unspecified: Secondary | ICD-10-CM | POA: Diagnosis not present

## 2019-10-19 DIAGNOSIS — R310 Gross hematuria: Secondary | ICD-10-CM | POA: Diagnosis not present

## 2019-10-19 DIAGNOSIS — C679 Malignant neoplasm of bladder, unspecified: Secondary | ICD-10-CM | POA: Diagnosis not present

## 2019-10-19 DIAGNOSIS — I480 Paroxysmal atrial fibrillation: Secondary | ICD-10-CM | POA: Diagnosis not present

## 2019-10-19 DIAGNOSIS — Z483 Aftercare following surgery for neoplasm: Secondary | ICD-10-CM | POA: Diagnosis not present

## 2019-10-20 DIAGNOSIS — I10 Essential (primary) hypertension: Secondary | ICD-10-CM | POA: Diagnosis not present

## 2019-10-20 DIAGNOSIS — Z801 Family history of malignant neoplasm of trachea, bronchus and lung: Secondary | ICD-10-CM | POA: Diagnosis not present

## 2019-10-20 DIAGNOSIS — E7800 Pure hypercholesterolemia, unspecified: Secondary | ICD-10-CM | POA: Diagnosis not present

## 2019-10-20 DIAGNOSIS — Z79899 Other long term (current) drug therapy: Secondary | ICD-10-CM | POA: Diagnosis not present

## 2019-10-20 DIAGNOSIS — E119 Type 2 diabetes mellitus without complications: Secondary | ICD-10-CM | POA: Diagnosis not present

## 2019-10-20 DIAGNOSIS — Z802 Family history of malignant neoplasm of other respiratory and intrathoracic organs: Secondary | ICD-10-CM | POA: Diagnosis not present

## 2019-10-20 DIAGNOSIS — I4891 Unspecified atrial fibrillation: Secondary | ICD-10-CM | POA: Diagnosis not present

## 2019-10-20 DIAGNOSIS — Z7982 Long term (current) use of aspirin: Secondary | ICD-10-CM | POA: Diagnosis not present

## 2019-10-20 DIAGNOSIS — Z952 Presence of prosthetic heart valve: Secondary | ICD-10-CM | POA: Diagnosis not present

## 2019-10-20 DIAGNOSIS — Z7902 Long term (current) use of antithrombotics/antiplatelets: Secondary | ICD-10-CM | POA: Diagnosis not present

## 2019-10-20 DIAGNOSIS — Z483 Aftercare following surgery for neoplasm: Secondary | ICD-10-CM | POA: Diagnosis not present

## 2019-10-20 DIAGNOSIS — Z800 Family history of malignant neoplasm of digestive organs: Secondary | ICD-10-CM | POA: Diagnosis not present

## 2019-10-20 DIAGNOSIS — C679 Malignant neoplasm of bladder, unspecified: Secondary | ICD-10-CM | POA: Diagnosis not present

## 2019-10-21 DIAGNOSIS — R310 Gross hematuria: Secondary | ICD-10-CM | POA: Diagnosis not present

## 2019-10-21 DIAGNOSIS — Z483 Aftercare following surgery for neoplasm: Secondary | ICD-10-CM | POA: Diagnosis not present

## 2019-10-21 DIAGNOSIS — I480 Paroxysmal atrial fibrillation: Secondary | ICD-10-CM | POA: Diagnosis not present

## 2019-10-21 DIAGNOSIS — I129 Hypertensive chronic kidney disease with stage 1 through stage 4 chronic kidney disease, or unspecified chronic kidney disease: Secondary | ICD-10-CM | POA: Diagnosis not present

## 2019-10-21 DIAGNOSIS — C679 Malignant neoplasm of bladder, unspecified: Secondary | ICD-10-CM | POA: Diagnosis not present

## 2019-10-21 DIAGNOSIS — N183 Chronic kidney disease, stage 3 unspecified: Secondary | ICD-10-CM | POA: Diagnosis not present

## 2019-10-22 ENCOUNTER — Other Ambulatory Visit: Payer: Self-pay

## 2019-10-22 ENCOUNTER — Ambulatory Visit (HOSPITAL_COMMUNITY): Payer: Medicare Other | Attending: Internal Medicine | Admitting: Speech Pathology

## 2019-10-22 ENCOUNTER — Ambulatory Visit (HOSPITAL_COMMUNITY)
Admission: RE | Admit: 2019-10-22 | Discharge: 2019-10-22 | Disposition: A | Discharge status: Home / Self Care | Payer: Medicare Other | Source: Ambulatory Visit | Attending: Internal Medicine | Admitting: Internal Medicine

## 2019-10-22 DIAGNOSIS — R05 Cough: Secondary | ICD-10-CM | POA: Diagnosis not present

## 2019-10-22 DIAGNOSIS — R1310 Dysphagia, unspecified: Secondary | ICD-10-CM | POA: Diagnosis not present

## 2019-10-22 DIAGNOSIS — R1319 Other dysphagia: Secondary | ICD-10-CM | POA: Diagnosis not present

## 2019-10-22 DIAGNOSIS — R310 Gross hematuria: Secondary | ICD-10-CM | POA: Diagnosis not present

## 2019-10-22 DIAGNOSIS — C679 Malignant neoplasm of bladder, unspecified: Secondary | ICD-10-CM | POA: Diagnosis not present

## 2019-10-22 DIAGNOSIS — I129 Hypertensive chronic kidney disease with stage 1 through stage 4 chronic kidney disease, or unspecified chronic kidney disease: Secondary | ICD-10-CM | POA: Diagnosis not present

## 2019-10-22 DIAGNOSIS — I480 Paroxysmal atrial fibrillation: Secondary | ICD-10-CM | POA: Diagnosis not present

## 2019-10-22 DIAGNOSIS — I639 Cerebral infarction, unspecified: Secondary | ICD-10-CM | POA: Diagnosis present

## 2019-10-22 DIAGNOSIS — Z483 Aftercare following surgery for neoplasm: Secondary | ICD-10-CM | POA: Diagnosis not present

## 2019-10-22 DIAGNOSIS — N183 Chronic kidney disease, stage 3 unspecified: Secondary | ICD-10-CM | POA: Diagnosis not present

## 2019-10-22 NOTE — Therapy (Signed)
[address] [place], [place], [address] Phone: [phone]   Fax:  [phone]  Modified Barium Swallow  Patient Details  Name: Bryan Christian MRN: [id number] Date of Birth: 1935/11/05 No data recorded  Encounter Date: 10/22/2019   End of Session - 10/22/19 1106    Visit Number 1    Number of Visits 1    SLP Start Time 6387    SLP Stop Time  1022    SLP Time Calculation (min) 20 min    Activity Tolerance Patient tolerated treatment well           Past Medical History:  Diagnosis Date  . Carotid artery occlusion   . Hyperlipidemia   . Hypertension   . Stroke [facility])     Past Surgical History:  Procedure Laterality Date  . CATARACT EXTRACTION, BILATERAL Bilateral     There were no vitals filed for this visit.    HPI: Pt was recently hospitalized  08/23/2019 - 09/01/2019 for  Malignant neoplasm of urinary bladder, status post CYSTOURETHROSCOPY, CYSTOSCOPY,  with TURBT ( TRANSURETHRAL RESECTION OF BLADDER TUMOR ) on 08/23/2019 at [facility].  Patient also has a history of stroke, hypertension, diabetes, Hypercholesteremia, and atrial fibrillation.  Pt transitioned from [place] on Friday, 09/24/19 and has been receiving [place], OT and PT since that time. Pt is on a regular thin diet and is currently utilizing the chin tuck maneuver. Pt would like to not have to use the chin tuck when he swallows. MBS ordered to objectively assess the swallowing function.   No data recorded   Assessment / Plan / Recommendation  CHL IP CLINICAL IMPRESSIONS 10/22/2019  Clinical Impression Pt presents with mild/mod pharyngeal dysphagia characterized by consistent penetration of thin liquids during the swallow and silent trace and silent and sensed (pt sensed one episode) moderate aspiration of thin liquids after the swallow. Oral phase is grossly [facility] however the pharyngeal phase reveals  decreased epiglottic deflection, decreased pharyngeal peristalsis, decreased laryngeal vestibule closure and decreased airway protection with thin liquids. Pt consumed Nectar thick liquids with minimal penetration and no aspiration. With thin liquids however, chin tuck was effective in minimizing penetration/aspiration, however with a large sip + chin tuck trace silent aspiration was still visualized; most effective strategy that consistently eliminated aspiration is a SMALL SIP, chin tuck and repeat dry swallow. Barium tab was administered with thin liquids, Pt threw his head back and demonstrated a moderate amount of aspiration that was sensed. Puree and solid textures were unremarkable. Recommend regular diet with thin liquids WITH PRECAUTIONS: Small sip + chin tuck + repeat dry swallow, meds to be administered WITH PUREE/or pudding. Recommend Pt continue with oropharyngeal strengthening exercises.  SLP reviewed recommendations and provided education on silent aspiration; SLP will contact [place] SLP to further review recommendations and results of study.   SLP Visit Diagnosis Dysphagia, pharyngeal phase (R13.13)  Attention and concentration deficit following --  Frontal lobe and executive function deficit following --  Impact on safety and function Mild aspiration risk;Moderate aspiration risk      No flowsheet data found.   No flowsheet data found.  CHL IP DIET RECOMMENDATION 10/22/2019  SLP Diet Recommendations Regular solids;Thin liquid  Liquid Administration via No straw;Cup  Medication Administration Whole meds with puree  Compensations Chin tuck;Multiple dry swallows after each bite/sip;Small sips/bites  Postural Changes Remain semi-upright after after feeds/meals (Comment);Seated upright at 90 degrees  CHL IP OTHER RECOMMENDATIONS 10/22/2019  Recommended Consults --  Oral Care Recommendations Oral care BID  Other Recommendations --      No flowsheet data found.    No flowsheet data  found.         CHL IP ORAL PHASE 10/22/2019  Oral Phase WFL  Oral - Pudding Teaspoon --  Oral - Pudding Cup --  Oral - Honey Teaspoon --  Oral - Honey Cup --  Oral - Nectar Teaspoon --  Oral - Nectar Cup --  Oral - Nectar Straw --  Oral - Thin Teaspoon --  Oral - Thin Cup --  Oral - Thin Straw --  Oral - Puree --  Oral - Mech Soft --  Oral - Regular --  Oral - Multi-Consistency --  Oral - Pill --  Oral Phase - Comment --    CHL IP PHARYNGEAL PHASE 10/22/2019  Pharyngeal Phase Impaired  Pharyngeal- Pudding Teaspoon --  Pharyngeal --  Pharyngeal- Pudding Cup --  Pharyngeal --  Pharyngeal- Honey Teaspoon --  Pharyngeal --  Pharyngeal- Honey Cup --  Pharyngeal --  Pharyngeal- Nectar Teaspoon --  Pharyngeal --  Pharyngeal- Nectar Cup Penetration/Aspiration during swallow  Pharyngeal Material enters airway, remains ABOVE vocal cords then ejected out  Pharyngeal- Nectar Straw --  Pharyngeal --  Pharyngeal- Thin Teaspoon Delayed swallow initiation-pyriform sinuses;Reduced pharyngeal peristalsis;Reduced epiglottic inversion;Reduced airway/laryngeal closure;Penetration/Aspiration before swallow;Penetration/Aspiration during swallow;Pharyngeal residue - valleculae;Pharyngeal residue - pyriform  Pharyngeal Material enters airway, remains ABOVE vocal cords and not ejected out  Pharyngeal- Thin Cup Delayed swallow initiation-pyriform sinuses;Reduced pharyngeal peristalsis;Reduced epiglottic inversion;Reduced airway/laryngeal closure;Penetration/Aspiration before swallow;Penetration/Aspiration during swallow;Pharyngeal residue - valleculae;Pharyngeal residue - pyriform  Pharyngeal Material enters airway, passes BELOW cords without attempt by patient to eject out (silent aspiration)  Pharyngeal- Thin Straw Delayed swallow initiation-pyriform sinuses;Reduced pharyngeal peristalsis;Reduced epiglottic inversion;Reduced airway/laryngeal closure;Penetration/Aspiration before  swallow;Penetration/Aspiration during swallow;Pharyngeal residue - valleculae;Pharyngeal residue - pyriform  Pharyngeal Material enters airway, passes BELOW cords without attempt by patient to eject out (silent aspiration)  Pharyngeal- Puree WFL;Pharyngeal residue - valleculae;Pharyngeal residue - pyriform  Pharyngeal --  Pharyngeal- Mechanical Soft --  Pharyngeal --  Pharyngeal- Regular WFL;Pharyngeal residue - valleculae;Pharyngeal residue - pyriform  Pharyngeal --  Pharyngeal- Multi-consistency --  Pharyngeal --  Pharyngeal- Pill Delayed swallow initiation-pyriform sinuses;Reduced pharyngeal peristalsis;Reduced epiglottic inversion;Reduced airway/laryngeal closure;Penetration/Aspiration before swallow;Penetration/Aspiration during swallow;Pharyngeal residue - valleculae;Pharyngeal residue - pyriform  Pharyngeal Material enters airway, passes BELOW cords without attempt by patient to eject out (silent aspiration)  Pharyngeal Comment --     CHL IP CERVICAL ESOPHAGEAL PHASE 10/22/2019  Cervical Esophageal Phase WFL  Pudding Teaspoon --  Pudding Cup --  Honey Teaspoon --  Honey Cup --  Nectar Teaspoon --  Nectar Cup --  Nectar Straw --  Thin Teaspoon --  Thin Cup --  Thin Straw --  Puree --  Mechanical Soft --  Regular --  Multi-consistency --  Pill --  Cervical Esophageal Comment --     Wende Bushy 10/22/2019, 11:07 AM        Problem List Patient Active Problem List   Diagnosis Date Noted  . Stroke ([place]) 03/03/2018  . Essential hypertension 03/01/2016  . Hyperlipidemia 03/01/2016  . Carotid stenosis 03/01/2016   Ezrah Dembeck H. Roddie Mc, CCC-SLP Speech Language Pathologist  Wende Bushy 10/22/2019, 11:07 AM  [facility] [address] [place], [place], [address] Phone: [phone]   Fax:  [phone]  Name: Bryan Christian MRN: [id number] Date  of Birth: 03-03-1936

## 2019-10-25 DIAGNOSIS — D631 Anemia in chronic kidney disease: Secondary | ICD-10-CM | POA: Diagnosis not present

## 2019-10-25 DIAGNOSIS — Z8673 Personal history of transient ischemic attack (TIA), and cerebral infarction without residual deficits: Secondary | ICD-10-CM | POA: Diagnosis not present

## 2019-10-25 DIAGNOSIS — Z7902 Long term (current) use of antithrombotics/antiplatelets: Secondary | ICD-10-CM | POA: Diagnosis not present

## 2019-10-25 DIAGNOSIS — I480 Paroxysmal atrial fibrillation: Secondary | ICD-10-CM | POA: Diagnosis not present

## 2019-10-25 DIAGNOSIS — Z483 Aftercare following surgery for neoplasm: Secondary | ICD-10-CM | POA: Diagnosis not present

## 2019-10-25 DIAGNOSIS — I771 Stricture of artery: Secondary | ICD-10-CM | POA: Diagnosis not present

## 2019-10-25 DIAGNOSIS — I6521 Occlusion and stenosis of right carotid artery: Secondary | ICD-10-CM | POA: Diagnosis not present

## 2019-10-25 DIAGNOSIS — R310 Gross hematuria: Secondary | ICD-10-CM | POA: Diagnosis not present

## 2019-10-25 DIAGNOSIS — I350 Nonrheumatic aortic (valve) stenosis: Secondary | ICD-10-CM | POA: Diagnosis not present

## 2019-10-25 DIAGNOSIS — Z9079 Acquired absence of other genital organ(s): Secondary | ICD-10-CM | POA: Diagnosis not present

## 2019-10-25 DIAGNOSIS — Z87891 Personal history of nicotine dependence: Secondary | ICD-10-CM | POA: Diagnosis not present

## 2019-10-25 DIAGNOSIS — I129 Hypertensive chronic kidney disease with stage 1 through stage 4 chronic kidney disease, or unspecified chronic kidney disease: Secondary | ICD-10-CM | POA: Diagnosis not present

## 2019-10-25 DIAGNOSIS — K2100 Gastro-esophageal reflux disease with esophagitis, without bleeding: Secondary | ICD-10-CM | POA: Diagnosis not present

## 2019-10-25 DIAGNOSIS — E7800 Pure hypercholesterolemia, unspecified: Secondary | ICD-10-CM | POA: Diagnosis not present

## 2019-10-25 DIAGNOSIS — N183 Chronic kidney disease, stage 3 unspecified: Secondary | ICD-10-CM | POA: Diagnosis not present

## 2019-10-25 DIAGNOSIS — Z7982 Long term (current) use of aspirin: Secondary | ICD-10-CM | POA: Diagnosis not present

## 2019-10-25 DIAGNOSIS — C679 Malignant neoplasm of bladder, unspecified: Secondary | ICD-10-CM | POA: Diagnosis not present

## 2019-10-25 DIAGNOSIS — R011 Cardiac murmur, unspecified: Secondary | ICD-10-CM | POA: Diagnosis not present

## 2019-10-25 DIAGNOSIS — Z952 Presence of prosthetic heart valve: Secondary | ICD-10-CM | POA: Diagnosis not present

## 2019-10-26 DIAGNOSIS — I129 Hypertensive chronic kidney disease with stage 1 through stage 4 chronic kidney disease, or unspecified chronic kidney disease: Secondary | ICD-10-CM | POA: Diagnosis not present

## 2019-10-26 DIAGNOSIS — I480 Paroxysmal atrial fibrillation: Secondary | ICD-10-CM | POA: Diagnosis not present

## 2019-10-26 DIAGNOSIS — I4891 Unspecified atrial fibrillation: Secondary | ICD-10-CM | POA: Diagnosis not present

## 2019-10-26 DIAGNOSIS — N183 Chronic kidney disease, stage 3 unspecified: Secondary | ICD-10-CM | POA: Diagnosis not present

## 2019-10-26 DIAGNOSIS — E119 Type 2 diabetes mellitus without complications: Secondary | ICD-10-CM | POA: Diagnosis not present

## 2019-10-26 DIAGNOSIS — Z952 Presence of prosthetic heart valve: Secondary | ICD-10-CM | POA: Diagnosis not present

## 2019-10-26 DIAGNOSIS — E7800 Pure hypercholesterolemia, unspecified: Secondary | ICD-10-CM | POA: Diagnosis not present

## 2019-10-26 DIAGNOSIS — I10 Essential (primary) hypertension: Secondary | ICD-10-CM | POA: Diagnosis not present

## 2019-10-26 DIAGNOSIS — Z483 Aftercare following surgery for neoplasm: Secondary | ICD-10-CM | POA: Diagnosis not present

## 2019-10-26 DIAGNOSIS — R310 Gross hematuria: Secondary | ICD-10-CM | POA: Diagnosis not present

## 2019-10-26 DIAGNOSIS — C679 Malignant neoplasm of bladder, unspecified: Secondary | ICD-10-CM | POA: Diagnosis not present

## 2019-10-27 DIAGNOSIS — N183 Chronic kidney disease, stage 3 unspecified: Secondary | ICD-10-CM | POA: Diagnosis not present

## 2019-10-27 DIAGNOSIS — I480 Paroxysmal atrial fibrillation: Secondary | ICD-10-CM | POA: Diagnosis not present

## 2019-10-27 DIAGNOSIS — R310 Gross hematuria: Secondary | ICD-10-CM | POA: Diagnosis not present

## 2019-10-27 DIAGNOSIS — Z483 Aftercare following surgery for neoplasm: Secondary | ICD-10-CM | POA: Diagnosis not present

## 2019-10-27 DIAGNOSIS — I129 Hypertensive chronic kidney disease with stage 1 through stage 4 chronic kidney disease, or unspecified chronic kidney disease: Secondary | ICD-10-CM | POA: Diagnosis not present

## 2019-10-27 DIAGNOSIS — C679 Malignant neoplasm of bladder, unspecified: Secondary | ICD-10-CM | POA: Diagnosis not present

## 2019-10-29 DIAGNOSIS — R310 Gross hematuria: Secondary | ICD-10-CM | POA: Diagnosis not present

## 2019-10-29 DIAGNOSIS — I129 Hypertensive chronic kidney disease with stage 1 through stage 4 chronic kidney disease, or unspecified chronic kidney disease: Secondary | ICD-10-CM | POA: Diagnosis not present

## 2019-10-29 DIAGNOSIS — Z483 Aftercare following surgery for neoplasm: Secondary | ICD-10-CM | POA: Diagnosis not present

## 2019-10-29 DIAGNOSIS — I480 Paroxysmal atrial fibrillation: Secondary | ICD-10-CM | POA: Diagnosis not present

## 2019-10-29 DIAGNOSIS — N183 Chronic kidney disease, stage 3 unspecified: Secondary | ICD-10-CM | POA: Diagnosis not present

## 2019-10-29 DIAGNOSIS — C679 Malignant neoplasm of bladder, unspecified: Secondary | ICD-10-CM | POA: Diagnosis not present

## 2019-11-02 DIAGNOSIS — N183 Chronic kidney disease, stage 3 unspecified: Secondary | ICD-10-CM | POA: Diagnosis not present

## 2019-11-02 DIAGNOSIS — R310 Gross hematuria: Secondary | ICD-10-CM | POA: Diagnosis not present

## 2019-11-02 DIAGNOSIS — Z483 Aftercare following surgery for neoplasm: Secondary | ICD-10-CM | POA: Diagnosis not present

## 2019-11-02 DIAGNOSIS — C679 Malignant neoplasm of bladder, unspecified: Secondary | ICD-10-CM | POA: Diagnosis not present

## 2019-11-02 DIAGNOSIS — I480 Paroxysmal atrial fibrillation: Secondary | ICD-10-CM | POA: Diagnosis not present

## 2019-11-02 DIAGNOSIS — I129 Hypertensive chronic kidney disease with stage 1 through stage 4 chronic kidney disease, or unspecified chronic kidney disease: Secondary | ICD-10-CM | POA: Diagnosis not present

## 2019-11-03 ENCOUNTER — Ambulatory Visit: Payer: Self-pay | Admitting: *Deleted

## 2019-11-08 DIAGNOSIS — I129 Hypertensive chronic kidney disease with stage 1 through stage 4 chronic kidney disease, or unspecified chronic kidney disease: Secondary | ICD-10-CM | POA: Diagnosis not present

## 2019-11-08 DIAGNOSIS — N183 Chronic kidney disease, stage 3 unspecified: Secondary | ICD-10-CM | POA: Diagnosis not present

## 2019-11-08 DIAGNOSIS — I480 Paroxysmal atrial fibrillation: Secondary | ICD-10-CM | POA: Diagnosis not present

## 2019-11-08 DIAGNOSIS — R310 Gross hematuria: Secondary | ICD-10-CM | POA: Diagnosis not present

## 2019-11-08 DIAGNOSIS — C679 Malignant neoplasm of bladder, unspecified: Secondary | ICD-10-CM | POA: Diagnosis not present

## 2019-11-08 DIAGNOSIS — Z483 Aftercare following surgery for neoplasm: Secondary | ICD-10-CM | POA: Diagnosis not present

## 2019-11-09 ENCOUNTER — Ambulatory Visit: Payer: Self-pay | Admitting: *Deleted

## 2019-11-09 DIAGNOSIS — C679 Malignant neoplasm of bladder, unspecified: Secondary | ICD-10-CM | POA: Diagnosis not present

## 2019-11-09 DIAGNOSIS — C670 Malignant neoplasm of trigone of bladder: Secondary | ICD-10-CM | POA: Diagnosis not present

## 2019-11-15 DIAGNOSIS — D500 Iron deficiency anemia secondary to blood loss (chronic): Secondary | ICD-10-CM | POA: Diagnosis not present

## 2019-11-15 DIAGNOSIS — C670 Malignant neoplasm of trigone of bladder: Secondary | ICD-10-CM | POA: Diagnosis not present

## 2019-11-16 ENCOUNTER — Other Ambulatory Visit: Payer: Self-pay | Admitting: *Deleted

## 2019-11-16 NOTE — Patient Outreach (Signed)
[name]) Care Management  11/16/2019  Bryan Christian [birth year] [id number]   Subjective: Telephone call to patient's home / mobile number, spoke with patient, and HIPAA verified.  Patient states he is doing pretty good, remembers speaking with this RNCM, follow up appointments with oncologist, and radiation oncologist have went ok,  will start radiation treatments on 11/24/2019.  States she will have treatments 5 days per week.  States he received the Advanced Directives packet, found the information to be very informative, reviewed information with his wife, has no additional questions at this time, and will follow up as needed.   States he also has a follow up appointment with pharmacist via phone on 11/30/2019 to review medications and office visit with primary MD on 12/01/2019.  Patient states he does not have any education material, transition of care, care coordination, disease management, disease monitoring, transportation, community resource, or pharmacy needs at this time. States he is very appreciative of the follow up and is in agreement  to receive 1 additional follow up call to assess for further CM needs.    Objective: Per KPN (Knowledge Performance Now, point of care tool) and chart review, patient hospitalized 08/23/2019 - 09/01/2019 for  Malignant neoplasm of urinary bladder, status post CYSTOURETHROSCOPY, CYSTOSCOPY,  with TURBT ( TRANSURETHRAL RESECTION OF BLADDER TUMOR ) on 08/23/2019 at [facility].  Patient also has a history of stroke, hypertension, diabetes, Hypercholesteremia, and atrial fibrillation.       Assessment: Received Medicare [place] Management Post Acute Care Coordinator referral on 09/24/2019.   Referral source: Marthenia Rolling.   Referral reason: Please assign to [place] for care coordination and complex case management. To transition from [place] on Friday, 09/24/19. Will have [facility] ([place]) . Spoke with wife who is agreeable to [facility] CM services.   Transition of care follow up completed  and will follow up assess for CM needs.       Plan: RNCM will call patient or patient's wife for telephone outreach attempt, within 30 business days, assess for CM needs, and follow up.     Jasnoor Trussell H. Annia Friendly, BSN, [place] Management [facility] Telephonic CM Phone: [phone] Fax: [phone]

## 2019-11-18 DIAGNOSIS — C670 Malignant neoplasm of trigone of bladder: Secondary | ICD-10-CM | POA: Diagnosis not present

## 2019-11-18 DIAGNOSIS — R58 Hemorrhage, not elsewhere classified: Secondary | ICD-10-CM | POA: Diagnosis not present

## 2019-11-18 DIAGNOSIS — R310 Gross hematuria: Secondary | ICD-10-CM | POA: Diagnosis not present

## 2019-11-18 DIAGNOSIS — Z952 Presence of prosthetic heart valve: Secondary | ICD-10-CM | POA: Diagnosis not present

## 2019-11-18 DIAGNOSIS — D500 Iron deficiency anemia secondary to blood loss (chronic): Secondary | ICD-10-CM | POA: Diagnosis not present

## 2019-11-23 DIAGNOSIS — Z802 Family history of malignant neoplasm of other respiratory and intrathoracic organs: Secondary | ICD-10-CM | POA: Diagnosis not present

## 2019-11-23 DIAGNOSIS — Z7982 Long term (current) use of aspirin: Secondary | ICD-10-CM | POA: Diagnosis not present

## 2019-11-23 DIAGNOSIS — E7800 Pure hypercholesterolemia, unspecified: Secondary | ICD-10-CM | POA: Diagnosis not present

## 2019-11-23 DIAGNOSIS — C670 Malignant neoplasm of trigone of bladder: Secondary | ICD-10-CM | POA: Diagnosis not present

## 2019-11-23 DIAGNOSIS — Z952 Presence of prosthetic heart valve: Secondary | ICD-10-CM | POA: Diagnosis not present

## 2019-11-23 DIAGNOSIS — Z801 Family history of malignant neoplasm of trachea, bronchus and lung: Secondary | ICD-10-CM | POA: Diagnosis not present

## 2019-11-23 DIAGNOSIS — Z800 Family history of malignant neoplasm of digestive organs: Secondary | ICD-10-CM | POA: Diagnosis not present

## 2019-11-23 DIAGNOSIS — C679 Malignant neoplasm of bladder, unspecified: Secondary | ICD-10-CM | POA: Diagnosis not present

## 2019-11-23 DIAGNOSIS — Z7902 Long term (current) use of antithrombotics/antiplatelets: Secondary | ICD-10-CM | POA: Diagnosis not present

## 2019-11-23 DIAGNOSIS — Z79899 Other long term (current) drug therapy: Secondary | ICD-10-CM | POA: Diagnosis not present

## 2019-11-23 DIAGNOSIS — E119 Type 2 diabetes mellitus without complications: Secondary | ICD-10-CM | POA: Diagnosis not present

## 2019-11-23 DIAGNOSIS — C672 Malignant neoplasm of lateral wall of bladder: Secondary | ICD-10-CM | POA: Diagnosis not present

## 2019-11-23 DIAGNOSIS — I10 Essential (primary) hypertension: Secondary | ICD-10-CM | POA: Diagnosis not present

## 2019-11-23 DIAGNOSIS — I4891 Unspecified atrial fibrillation: Secondary | ICD-10-CM | POA: Diagnosis not present

## 2019-11-25 DIAGNOSIS — E7800 Pure hypercholesterolemia, unspecified: Secondary | ICD-10-CM | POA: Diagnosis not present

## 2019-11-25 DIAGNOSIS — I4891 Unspecified atrial fibrillation: Secondary | ICD-10-CM | POA: Diagnosis not present

## 2019-11-25 DIAGNOSIS — I10 Essential (primary) hypertension: Secondary | ICD-10-CM | POA: Diagnosis not present

## 2019-11-25 DIAGNOSIS — Z952 Presence of prosthetic heart valve: Secondary | ICD-10-CM | POA: Diagnosis not present

## 2019-11-25 DIAGNOSIS — C672 Malignant neoplasm of lateral wall of bladder: Secondary | ICD-10-CM | POA: Diagnosis not present

## 2019-11-25 DIAGNOSIS — C679 Malignant neoplasm of bladder, unspecified: Secondary | ICD-10-CM | POA: Diagnosis not present

## 2019-11-25 DIAGNOSIS — E119 Type 2 diabetes mellitus without complications: Secondary | ICD-10-CM | POA: Diagnosis not present

## 2019-11-26 DIAGNOSIS — C672 Malignant neoplasm of lateral wall of bladder: Secondary | ICD-10-CM | POA: Diagnosis not present

## 2019-11-26 DIAGNOSIS — Z952 Presence of prosthetic heart valve: Secondary | ICD-10-CM | POA: Diagnosis not present

## 2019-11-26 DIAGNOSIS — E7800 Pure hypercholesterolemia, unspecified: Secondary | ICD-10-CM | POA: Diagnosis not present

## 2019-11-26 DIAGNOSIS — I10 Essential (primary) hypertension: Secondary | ICD-10-CM | POA: Diagnosis not present

## 2019-11-26 DIAGNOSIS — E119 Type 2 diabetes mellitus without complications: Secondary | ICD-10-CM | POA: Diagnosis not present

## 2019-11-26 DIAGNOSIS — I4891 Unspecified atrial fibrillation: Secondary | ICD-10-CM | POA: Diagnosis not present

## 2019-11-26 DIAGNOSIS — C679 Malignant neoplasm of bladder, unspecified: Secondary | ICD-10-CM | POA: Diagnosis not present

## 2019-11-29 DIAGNOSIS — E119 Type 2 diabetes mellitus without complications: Secondary | ICD-10-CM | POA: Diagnosis not present

## 2019-11-29 DIAGNOSIS — Z952 Presence of prosthetic heart valve: Secondary | ICD-10-CM | POA: Diagnosis not present

## 2019-11-29 DIAGNOSIS — C672 Malignant neoplasm of lateral wall of bladder: Secondary | ICD-10-CM | POA: Diagnosis not present

## 2019-11-29 DIAGNOSIS — I10 Essential (primary) hypertension: Secondary | ICD-10-CM | POA: Diagnosis not present

## 2019-11-29 DIAGNOSIS — I4891 Unspecified atrial fibrillation: Secondary | ICD-10-CM | POA: Diagnosis not present

## 2019-11-29 DIAGNOSIS — C679 Malignant neoplasm of bladder, unspecified: Secondary | ICD-10-CM | POA: Diagnosis not present

## 2019-11-29 DIAGNOSIS — E7800 Pure hypercholesterolemia, unspecified: Secondary | ICD-10-CM | POA: Diagnosis not present

## 2019-11-30 DIAGNOSIS — Z952 Presence of prosthetic heart valve: Secondary | ICD-10-CM | POA: Diagnosis not present

## 2019-11-30 DIAGNOSIS — E119 Type 2 diabetes mellitus without complications: Secondary | ICD-10-CM | POA: Diagnosis not present

## 2019-11-30 DIAGNOSIS — I10 Essential (primary) hypertension: Secondary | ICD-10-CM | POA: Diagnosis not present

## 2019-11-30 DIAGNOSIS — E7800 Pure hypercholesterolemia, unspecified: Secondary | ICD-10-CM | POA: Diagnosis not present

## 2019-11-30 DIAGNOSIS — C672 Malignant neoplasm of lateral wall of bladder: Secondary | ICD-10-CM | POA: Diagnosis not present

## 2019-11-30 DIAGNOSIS — C679 Malignant neoplasm of bladder, unspecified: Secondary | ICD-10-CM | POA: Diagnosis not present

## 2019-11-30 DIAGNOSIS — I4891 Unspecified atrial fibrillation: Secondary | ICD-10-CM | POA: Diagnosis not present

## 2019-12-01 DIAGNOSIS — I10 Essential (primary) hypertension: Secondary | ICD-10-CM | POA: Diagnosis not present

## 2019-12-01 DIAGNOSIS — C672 Malignant neoplasm of lateral wall of bladder: Secondary | ICD-10-CM | POA: Diagnosis not present

## 2019-12-01 DIAGNOSIS — C679 Malignant neoplasm of bladder, unspecified: Secondary | ICD-10-CM | POA: Diagnosis not present

## 2019-12-01 DIAGNOSIS — I4891 Unspecified atrial fibrillation: Secondary | ICD-10-CM | POA: Diagnosis not present

## 2019-12-01 DIAGNOSIS — E7800 Pure hypercholesterolemia, unspecified: Secondary | ICD-10-CM | POA: Diagnosis not present

## 2019-12-01 DIAGNOSIS — Z952 Presence of prosthetic heart valve: Secondary | ICD-10-CM | POA: Diagnosis not present

## 2019-12-01 DIAGNOSIS — E119 Type 2 diabetes mellitus without complications: Secondary | ICD-10-CM | POA: Diagnosis not present

## 2019-12-02 DIAGNOSIS — Z952 Presence of prosthetic heart valve: Secondary | ICD-10-CM | POA: Diagnosis not present

## 2019-12-02 DIAGNOSIS — I10 Essential (primary) hypertension: Secondary | ICD-10-CM | POA: Diagnosis not present

## 2019-12-02 DIAGNOSIS — I4891 Unspecified atrial fibrillation: Secondary | ICD-10-CM | POA: Diagnosis not present

## 2019-12-02 DIAGNOSIS — C672 Malignant neoplasm of lateral wall of bladder: Secondary | ICD-10-CM | POA: Diagnosis not present

## 2019-12-02 DIAGNOSIS — E119 Type 2 diabetes mellitus without complications: Secondary | ICD-10-CM | POA: Diagnosis not present

## 2019-12-02 DIAGNOSIS — E7800 Pure hypercholesterolemia, unspecified: Secondary | ICD-10-CM | POA: Diagnosis not present

## 2019-12-02 DIAGNOSIS — C679 Malignant neoplasm of bladder, unspecified: Secondary | ICD-10-CM | POA: Diagnosis not present

## 2019-12-03 DIAGNOSIS — Z952 Presence of prosthetic heart valve: Secondary | ICD-10-CM | POA: Diagnosis not present

## 2019-12-03 DIAGNOSIS — D500 Iron deficiency anemia secondary to blood loss (chronic): Secondary | ICD-10-CM | POA: Diagnosis not present

## 2019-12-03 DIAGNOSIS — R58 Hemorrhage, not elsewhere classified: Secondary | ICD-10-CM | POA: Diagnosis not present

## 2019-12-03 DIAGNOSIS — C679 Malignant neoplasm of bladder, unspecified: Secondary | ICD-10-CM | POA: Diagnosis not present

## 2019-12-03 DIAGNOSIS — I4891 Unspecified atrial fibrillation: Secondary | ICD-10-CM | POA: Diagnosis not present

## 2019-12-03 DIAGNOSIS — C672 Malignant neoplasm of lateral wall of bladder: Secondary | ICD-10-CM | POA: Diagnosis not present

## 2019-12-03 DIAGNOSIS — R310 Gross hematuria: Secondary | ICD-10-CM | POA: Diagnosis not present

## 2019-12-03 DIAGNOSIS — I63119 Cerebral infarction due to embolism of unspecified vertebral artery: Secondary | ICD-10-CM | POA: Diagnosis not present

## 2019-12-03 DIAGNOSIS — I350 Nonrheumatic aortic (valve) stenosis: Secondary | ICD-10-CM | POA: Diagnosis not present

## 2019-12-03 DIAGNOSIS — R350 Frequency of micturition: Secondary | ICD-10-CM | POA: Diagnosis not present

## 2019-12-03 DIAGNOSIS — I10 Essential (primary) hypertension: Secondary | ICD-10-CM | POA: Diagnosis not present

## 2019-12-03 DIAGNOSIS — E7800 Pure hypercholesterolemia, unspecified: Secondary | ICD-10-CM | POA: Diagnosis not present

## 2019-12-03 DIAGNOSIS — C670 Malignant neoplasm of trigone of bladder: Secondary | ICD-10-CM | POA: Diagnosis not present

## 2019-12-03 DIAGNOSIS — E119 Type 2 diabetes mellitus without complications: Secondary | ICD-10-CM | POA: Diagnosis not present

## 2019-12-06 DIAGNOSIS — C679 Malignant neoplasm of bladder, unspecified: Secondary | ICD-10-CM | POA: Diagnosis not present

## 2019-12-06 DIAGNOSIS — I10 Essential (primary) hypertension: Secondary | ICD-10-CM | POA: Diagnosis not present

## 2019-12-06 DIAGNOSIS — C672 Malignant neoplasm of lateral wall of bladder: Secondary | ICD-10-CM | POA: Diagnosis not present

## 2019-12-06 DIAGNOSIS — Z952 Presence of prosthetic heart valve: Secondary | ICD-10-CM | POA: Diagnosis not present

## 2019-12-06 DIAGNOSIS — E119 Type 2 diabetes mellitus without complications: Secondary | ICD-10-CM | POA: Diagnosis not present

## 2019-12-06 DIAGNOSIS — E7800 Pure hypercholesterolemia, unspecified: Secondary | ICD-10-CM | POA: Diagnosis not present

## 2019-12-06 DIAGNOSIS — I4891 Unspecified atrial fibrillation: Secondary | ICD-10-CM | POA: Diagnosis not present

## 2019-12-07 DIAGNOSIS — C679 Malignant neoplasm of bladder, unspecified: Secondary | ICD-10-CM | POA: Diagnosis not present

## 2019-12-07 DIAGNOSIS — I4891 Unspecified atrial fibrillation: Secondary | ICD-10-CM | POA: Diagnosis not present

## 2019-12-07 DIAGNOSIS — Z952 Presence of prosthetic heart valve: Secondary | ICD-10-CM | POA: Diagnosis not present

## 2019-12-07 DIAGNOSIS — C672 Malignant neoplasm of lateral wall of bladder: Secondary | ICD-10-CM | POA: Diagnosis not present

## 2019-12-07 DIAGNOSIS — E119 Type 2 diabetes mellitus without complications: Secondary | ICD-10-CM | POA: Diagnosis not present

## 2019-12-07 DIAGNOSIS — E7800 Pure hypercholesterolemia, unspecified: Secondary | ICD-10-CM | POA: Diagnosis not present

## 2019-12-07 DIAGNOSIS — I10 Essential (primary) hypertension: Secondary | ICD-10-CM | POA: Diagnosis not present

## 2019-12-08 DIAGNOSIS — I10 Essential (primary) hypertension: Secondary | ICD-10-CM | POA: Diagnosis not present

## 2019-12-08 DIAGNOSIS — I4891 Unspecified atrial fibrillation: Secondary | ICD-10-CM | POA: Diagnosis not present

## 2019-12-08 DIAGNOSIS — Z952 Presence of prosthetic heart valve: Secondary | ICD-10-CM | POA: Diagnosis not present

## 2019-12-08 DIAGNOSIS — E7800 Pure hypercholesterolemia, unspecified: Secondary | ICD-10-CM | POA: Diagnosis not present

## 2019-12-08 DIAGNOSIS — C672 Malignant neoplasm of lateral wall of bladder: Secondary | ICD-10-CM | POA: Diagnosis not present

## 2019-12-08 DIAGNOSIS — C679 Malignant neoplasm of bladder, unspecified: Secondary | ICD-10-CM | POA: Diagnosis not present

## 2019-12-08 DIAGNOSIS — E119 Type 2 diabetes mellitus without complications: Secondary | ICD-10-CM | POA: Diagnosis not present

## 2019-12-09 DIAGNOSIS — C679 Malignant neoplasm of bladder, unspecified: Secondary | ICD-10-CM | POA: Diagnosis not present

## 2019-12-09 DIAGNOSIS — E119 Type 2 diabetes mellitus without complications: Secondary | ICD-10-CM | POA: Diagnosis not present

## 2019-12-09 DIAGNOSIS — I10 Essential (primary) hypertension: Secondary | ICD-10-CM | POA: Diagnosis not present

## 2019-12-09 DIAGNOSIS — C672 Malignant neoplasm of lateral wall of bladder: Secondary | ICD-10-CM | POA: Diagnosis not present

## 2019-12-09 DIAGNOSIS — Z952 Presence of prosthetic heart valve: Secondary | ICD-10-CM | POA: Diagnosis not present

## 2019-12-09 DIAGNOSIS — I4891 Unspecified atrial fibrillation: Secondary | ICD-10-CM | POA: Diagnosis not present

## 2019-12-09 DIAGNOSIS — E7800 Pure hypercholesterolemia, unspecified: Secondary | ICD-10-CM | POA: Diagnosis not present

## 2019-12-10 DIAGNOSIS — C672 Malignant neoplasm of lateral wall of bladder: Secondary | ICD-10-CM | POA: Diagnosis not present

## 2019-12-10 DIAGNOSIS — N3040 Irradiation cystitis without hematuria: Secondary | ICD-10-CM | POA: Diagnosis not present

## 2019-12-10 DIAGNOSIS — E7800 Pure hypercholesterolemia, unspecified: Secondary | ICD-10-CM | POA: Diagnosis not present

## 2019-12-10 DIAGNOSIS — R310 Gross hematuria: Secondary | ICD-10-CM | POA: Diagnosis not present

## 2019-12-10 DIAGNOSIS — C679 Malignant neoplasm of bladder, unspecified: Secondary | ICD-10-CM | POA: Diagnosis not present

## 2019-12-10 DIAGNOSIS — Z952 Presence of prosthetic heart valve: Secondary | ICD-10-CM | POA: Diagnosis not present

## 2019-12-10 DIAGNOSIS — I10 Essential (primary) hypertension: Secondary | ICD-10-CM | POA: Diagnosis not present

## 2019-12-10 DIAGNOSIS — E119 Type 2 diabetes mellitus without complications: Secondary | ICD-10-CM | POA: Diagnosis not present

## 2019-12-10 DIAGNOSIS — C670 Malignant neoplasm of trigone of bladder: Secondary | ICD-10-CM | POA: Diagnosis not present

## 2019-12-10 DIAGNOSIS — R350 Frequency of micturition: Secondary | ICD-10-CM | POA: Diagnosis not present

## 2019-12-10 DIAGNOSIS — I4891 Unspecified atrial fibrillation: Secondary | ICD-10-CM | POA: Diagnosis not present

## 2019-12-13 DIAGNOSIS — I4891 Unspecified atrial fibrillation: Secondary | ICD-10-CM | POA: Diagnosis not present

## 2019-12-13 DIAGNOSIS — E7800 Pure hypercholesterolemia, unspecified: Secondary | ICD-10-CM | POA: Diagnosis not present

## 2019-12-13 DIAGNOSIS — Z952 Presence of prosthetic heart valve: Secondary | ICD-10-CM | POA: Diagnosis not present

## 2019-12-13 DIAGNOSIS — I10 Essential (primary) hypertension: Secondary | ICD-10-CM | POA: Diagnosis not present

## 2019-12-13 DIAGNOSIS — C679 Malignant neoplasm of bladder, unspecified: Secondary | ICD-10-CM | POA: Diagnosis not present

## 2019-12-13 DIAGNOSIS — E119 Type 2 diabetes mellitus without complications: Secondary | ICD-10-CM | POA: Diagnosis not present

## 2019-12-13 DIAGNOSIS — C672 Malignant neoplasm of lateral wall of bladder: Secondary | ICD-10-CM | POA: Diagnosis not present

## 2019-12-14 DIAGNOSIS — E7800 Pure hypercholesterolemia, unspecified: Secondary | ICD-10-CM | POA: Diagnosis not present

## 2019-12-14 DIAGNOSIS — E119 Type 2 diabetes mellitus without complications: Secondary | ICD-10-CM | POA: Diagnosis not present

## 2019-12-14 DIAGNOSIS — Z952 Presence of prosthetic heart valve: Secondary | ICD-10-CM | POA: Diagnosis not present

## 2019-12-14 DIAGNOSIS — C672 Malignant neoplasm of lateral wall of bladder: Secondary | ICD-10-CM | POA: Diagnosis not present

## 2019-12-14 DIAGNOSIS — C679 Malignant neoplasm of bladder, unspecified: Secondary | ICD-10-CM | POA: Diagnosis not present

## 2019-12-14 DIAGNOSIS — K2100 Gastro-esophageal reflux disease with esophagitis, without bleeding: Secondary | ICD-10-CM | POA: Diagnosis not present

## 2019-12-14 DIAGNOSIS — I4891 Unspecified atrial fibrillation: Secondary | ICD-10-CM | POA: Diagnosis not present

## 2019-12-14 DIAGNOSIS — I10 Essential (primary) hypertension: Secondary | ICD-10-CM | POA: Diagnosis not present

## 2019-12-15 DIAGNOSIS — R809 Proteinuria, unspecified: Secondary | ICD-10-CM | POA: Diagnosis not present

## 2019-12-15 DIAGNOSIS — Z801 Family history of malignant neoplasm of trachea, bronchus and lung: Secondary | ICD-10-CM | POA: Diagnosis not present

## 2019-12-15 DIAGNOSIS — E7800 Pure hypercholesterolemia, unspecified: Secondary | ICD-10-CM | POA: Diagnosis not present

## 2019-12-15 DIAGNOSIS — I10 Essential (primary) hypertension: Secondary | ICD-10-CM | POA: Diagnosis not present

## 2019-12-15 DIAGNOSIS — C670 Malignant neoplasm of trigone of bladder: Secondary | ICD-10-CM | POA: Diagnosis not present

## 2019-12-15 DIAGNOSIS — C672 Malignant neoplasm of lateral wall of bladder: Secondary | ICD-10-CM | POA: Diagnosis not present

## 2019-12-15 DIAGNOSIS — Z79899 Other long term (current) drug therapy: Secondary | ICD-10-CM | POA: Diagnosis not present

## 2019-12-15 DIAGNOSIS — R310 Gross hematuria: Secondary | ICD-10-CM | POA: Diagnosis not present

## 2019-12-15 DIAGNOSIS — E119 Type 2 diabetes mellitus without complications: Secondary | ICD-10-CM | POA: Diagnosis not present

## 2019-12-15 DIAGNOSIS — Z952 Presence of prosthetic heart valve: Secondary | ICD-10-CM | POA: Diagnosis not present

## 2019-12-15 DIAGNOSIS — Z802 Family history of malignant neoplasm of other respiratory and intrathoracic organs: Secondary | ICD-10-CM | POA: Diagnosis not present

## 2019-12-15 DIAGNOSIS — Z7902 Long term (current) use of antithrombotics/antiplatelets: Secondary | ICD-10-CM | POA: Diagnosis not present

## 2019-12-15 DIAGNOSIS — E1129 Type 2 diabetes mellitus with other diabetic kidney complication: Secondary | ICD-10-CM | POA: Diagnosis not present

## 2019-12-15 DIAGNOSIS — D500 Iron deficiency anemia secondary to blood loss (chronic): Secondary | ICD-10-CM | POA: Diagnosis not present

## 2019-12-15 DIAGNOSIS — C679 Malignant neoplasm of bladder, unspecified: Secondary | ICD-10-CM | POA: Diagnosis not present

## 2019-12-15 DIAGNOSIS — Z800 Family history of malignant neoplasm of digestive organs: Secondary | ICD-10-CM | POA: Diagnosis not present

## 2019-12-15 DIAGNOSIS — I4891 Unspecified atrial fibrillation: Secondary | ICD-10-CM | POA: Diagnosis not present

## 2019-12-15 DIAGNOSIS — N3040 Irradiation cystitis without hematuria: Secondary | ICD-10-CM | POA: Diagnosis not present

## 2019-12-15 DIAGNOSIS — Z7982 Long term (current) use of aspirin: Secondary | ICD-10-CM | POA: Diagnosis not present

## 2019-12-16 ENCOUNTER — Other Ambulatory Visit: Payer: Self-pay | Admitting: *Deleted

## 2019-12-16 DIAGNOSIS — E7800 Pure hypercholesterolemia, unspecified: Secondary | ICD-10-CM | POA: Diagnosis not present

## 2019-12-16 DIAGNOSIS — E119 Type 2 diabetes mellitus without complications: Secondary | ICD-10-CM | POA: Diagnosis not present

## 2019-12-16 DIAGNOSIS — I4891 Unspecified atrial fibrillation: Secondary | ICD-10-CM | POA: Diagnosis not present

## 2019-12-16 DIAGNOSIS — I10 Essential (primary) hypertension: Secondary | ICD-10-CM | POA: Diagnosis not present

## 2019-12-16 DIAGNOSIS — C679 Malignant neoplasm of bladder, unspecified: Secondary | ICD-10-CM | POA: Diagnosis not present

## 2019-12-16 DIAGNOSIS — R350 Frequency of micturition: Secondary | ICD-10-CM | POA: Insufficient documentation

## 2019-12-16 DIAGNOSIS — Z952 Presence of prosthetic heart valve: Secondary | ICD-10-CM | POA: Diagnosis not present

## 2019-12-16 DIAGNOSIS — C672 Malignant neoplasm of lateral wall of bladder: Secondary | ICD-10-CM | POA: Diagnosis not present

## 2019-12-16 NOTE — Patient Outreach (Signed)
[facility]) Care Management  12/16/2019  AULTON ROUTT 09-18-1935 [id number]   RNCM received the following message from [place] [facility] [place] Management: Earlie Lou,  Mr. Belcher called asking to call him on Friday to letter in the day he will be in Dialysis at that time. You have an appointment with him at 10:00 on Friday.  Thank you.   Plan: RNCM will rescheduled appointment per patient's request.    Manika Hast H. Annia Friendly, BSN, [place] Management [facility] Telephonic CM Phone: [phone] Fax: [phone]

## 2019-12-17 ENCOUNTER — Ambulatory Visit: Payer: Medicare Other | Admitting: *Deleted

## 2019-12-17 ENCOUNTER — Other Ambulatory Visit: Payer: Self-pay | Admitting: *Deleted

## 2019-12-17 ENCOUNTER — Ambulatory Visit: Payer: Self-pay | Admitting: *Deleted

## 2019-12-17 DIAGNOSIS — I10 Essential (primary) hypertension: Secondary | ICD-10-CM | POA: Diagnosis not present

## 2019-12-17 DIAGNOSIS — E1129 Type 2 diabetes mellitus with other diabetic kidney complication: Secondary | ICD-10-CM | POA: Diagnosis not present

## 2019-12-17 DIAGNOSIS — D500 Iron deficiency anemia secondary to blood loss (chronic): Secondary | ICD-10-CM | POA: Diagnosis not present

## 2019-12-17 DIAGNOSIS — I4891 Unspecified atrial fibrillation: Secondary | ICD-10-CM | POA: Diagnosis not present

## 2019-12-17 DIAGNOSIS — E7800 Pure hypercholesterolemia, unspecified: Secondary | ICD-10-CM | POA: Diagnosis not present

## 2019-12-17 DIAGNOSIS — R310 Gross hematuria: Secondary | ICD-10-CM | POA: Diagnosis not present

## 2019-12-17 DIAGNOSIS — E119 Type 2 diabetes mellitus without complications: Secondary | ICD-10-CM | POA: Diagnosis not present

## 2019-12-17 DIAGNOSIS — R809 Proteinuria, unspecified: Secondary | ICD-10-CM | POA: Diagnosis not present

## 2019-12-17 DIAGNOSIS — N3040 Irradiation cystitis without hematuria: Secondary | ICD-10-CM | POA: Diagnosis not present

## 2019-12-17 DIAGNOSIS — Z952 Presence of prosthetic heart valve: Secondary | ICD-10-CM | POA: Diagnosis not present

## 2019-12-17 DIAGNOSIS — C672 Malignant neoplasm of lateral wall of bladder: Secondary | ICD-10-CM | POA: Diagnosis not present

## 2019-12-17 DIAGNOSIS — C679 Malignant neoplasm of bladder, unspecified: Secondary | ICD-10-CM | POA: Diagnosis not present

## 2019-12-17 DIAGNOSIS — C670 Malignant neoplasm of trigone of bladder: Secondary | ICD-10-CM | POA: Diagnosis not present

## 2019-12-17 NOTE — Patient Outreach (Signed)
[facility]) Care Management  12/17/2019  Bryan Christian [birth year] [id number]   Subjective: Telephone call to patient's home number, spoke with patient, and HIPAA verified.  Patient states he is feeling very well, had a follow up visit with medical oncologist today, appointment was very encourage, and went well.  RNCM advised patient received his message regarding rescheduling today's appointment and RNCM was appreciative of the update.   States he is not receiving dialysis and is only receiving radiation treatments at this time.  Patient states he has completed 20 out of 30 planned radiation treatments and today was the most recent treatment.   States radiation treatments are going well.    States his only problem/ issue  is the frequent urination,  this issue is being monitored, and treated by urologist.  States he has a follow up appointment with the urologist in a few weeks and outcome of the radiation treatment will be determined in a few months.   Patient states he does not have any education material, transition of care, care coordination, disease management, disease monitoring, transportation, community resource, or pharmacy needs at this time.  States he does not want to enroll in care management program at this time, feels things are stable, has all the needed education, and will reach out to this [facility] and/ or [place] Management if assistance needed in the future.  States he is very appreciative of the follow up, has this RNCM's contact information, and [facility] Care Management information.     Objective:Per KPN (Knowledge Performance Now, point of care tool) and chart review,patient hospitalized 08/23/2019 - 09/01/2019 forMalignant neoplasm of urinary bladder, status post CYSTOURETHROSCOPY,CYSTOSCOPY, with TURBT ( TRANSURETHRAL RESECTION OF BLADDER TUMOR) on 08/23/2019 [facility]. Patient also has a history of stroke, hypertension, diabetes,  Hypercholesteremia, and atrial fibrillation.      Assessment: Received Medicare [place] Management Post Acute Care Coordinator referral on 09/24/2019. Referral source: Marthenia Rolling. Referral reason: Please assign to [place] for care coordination and complex case management. To transition from [place] Friday, 09/24/19. Will have [facility] ([place]). Spoke with wife who is agreeable to [facility] CM services.Transition of care follow up completed and no further care management needs at this time.        Plan:RNCM will complete case closure due to follow up completed / no care management needs.  RNCM will send MD case closure letter.        [place] Dodgen H. Annia Friendly, BSN, [place] Management [facility] Telephonic CM Phone: [phone] Fax: [phone]

## 2019-12-21 DIAGNOSIS — Z952 Presence of prosthetic heart valve: Secondary | ICD-10-CM | POA: Diagnosis not present

## 2019-12-21 DIAGNOSIS — C672 Malignant neoplasm of lateral wall of bladder: Secondary | ICD-10-CM | POA: Diagnosis not present

## 2019-12-21 DIAGNOSIS — I10 Essential (primary) hypertension: Secondary | ICD-10-CM | POA: Diagnosis not present

## 2019-12-21 DIAGNOSIS — E119 Type 2 diabetes mellitus without complications: Secondary | ICD-10-CM | POA: Diagnosis not present

## 2019-12-21 DIAGNOSIS — C679 Malignant neoplasm of bladder, unspecified: Secondary | ICD-10-CM | POA: Diagnosis not present

## 2019-12-21 DIAGNOSIS — I4891 Unspecified atrial fibrillation: Secondary | ICD-10-CM | POA: Diagnosis not present

## 2019-12-21 DIAGNOSIS — E7800 Pure hypercholesterolemia, unspecified: Secondary | ICD-10-CM | POA: Diagnosis not present

## 2019-12-22 DIAGNOSIS — E119 Type 2 diabetes mellitus without complications: Secondary | ICD-10-CM | POA: Diagnosis not present

## 2019-12-22 DIAGNOSIS — C679 Malignant neoplasm of bladder, unspecified: Secondary | ICD-10-CM | POA: Diagnosis not present

## 2019-12-22 DIAGNOSIS — C672 Malignant neoplasm of lateral wall of bladder: Secondary | ICD-10-CM | POA: Diagnosis not present

## 2019-12-22 DIAGNOSIS — Z952 Presence of prosthetic heart valve: Secondary | ICD-10-CM | POA: Diagnosis not present

## 2019-12-22 DIAGNOSIS — I4891 Unspecified atrial fibrillation: Secondary | ICD-10-CM | POA: Diagnosis not present

## 2019-12-22 DIAGNOSIS — E7800 Pure hypercholesterolemia, unspecified: Secondary | ICD-10-CM | POA: Diagnosis not present

## 2019-12-22 DIAGNOSIS — I10 Essential (primary) hypertension: Secondary | ICD-10-CM | POA: Diagnosis not present

## 2019-12-23 DIAGNOSIS — I4891 Unspecified atrial fibrillation: Secondary | ICD-10-CM | POA: Diagnosis not present

## 2019-12-23 DIAGNOSIS — C672 Malignant neoplasm of lateral wall of bladder: Secondary | ICD-10-CM | POA: Diagnosis not present

## 2019-12-23 DIAGNOSIS — E119 Type 2 diabetes mellitus without complications: Secondary | ICD-10-CM | POA: Diagnosis not present

## 2019-12-23 DIAGNOSIS — E7800 Pure hypercholesterolemia, unspecified: Secondary | ICD-10-CM | POA: Diagnosis not present

## 2019-12-23 DIAGNOSIS — I10 Essential (primary) hypertension: Secondary | ICD-10-CM | POA: Diagnosis not present

## 2019-12-23 DIAGNOSIS — C679 Malignant neoplasm of bladder, unspecified: Secondary | ICD-10-CM | POA: Diagnosis not present

## 2019-12-23 DIAGNOSIS — Z952 Presence of prosthetic heart valve: Secondary | ICD-10-CM | POA: Diagnosis not present

## 2019-12-23 DIAGNOSIS — N3040 Irradiation cystitis without hematuria: Secondary | ICD-10-CM | POA: Insufficient documentation

## 2020-01-06 DIAGNOSIS — D508 Other iron deficiency anemias: Secondary | ICD-10-CM | POA: Diagnosis not present

## 2020-01-06 DIAGNOSIS — C670 Malignant neoplasm of trigone of bladder: Secondary | ICD-10-CM | POA: Diagnosis not present

## 2020-01-06 DIAGNOSIS — Z08 Encounter for follow-up examination after completed treatment for malignant neoplasm: Secondary | ICD-10-CM | POA: Diagnosis not present

## 2020-01-06 DIAGNOSIS — C679 Malignant neoplasm of bladder, unspecified: Secondary | ICD-10-CM | POA: Diagnosis not present

## 2020-01-06 DIAGNOSIS — Z8551 Personal history of malignant neoplasm of bladder: Secondary | ICD-10-CM | POA: Diagnosis not present

## 2020-01-06 DIAGNOSIS — N3040 Irradiation cystitis without hematuria: Secondary | ICD-10-CM | POA: Diagnosis not present

## 2020-01-06 DIAGNOSIS — Z952 Presence of prosthetic heart valve: Secondary | ICD-10-CM | POA: Diagnosis not present

## 2020-01-10 DIAGNOSIS — Z0000 Encounter for general adult medical examination without abnormal findings: Secondary | ICD-10-CM | POA: Diagnosis not present

## 2020-01-10 DIAGNOSIS — R5383 Other fatigue: Secondary | ICD-10-CM | POA: Diagnosis not present

## 2020-01-10 DIAGNOSIS — Z79899 Other long term (current) drug therapy: Secondary | ICD-10-CM | POA: Diagnosis not present

## 2020-01-10 DIAGNOSIS — Z1331 Encounter for screening for depression: Secondary | ICD-10-CM | POA: Diagnosis not present

## 2020-01-10 DIAGNOSIS — Z7189 Other specified counseling: Secondary | ICD-10-CM | POA: Diagnosis not present

## 2020-01-10 DIAGNOSIS — Z299 Encounter for prophylactic measures, unspecified: Secondary | ICD-10-CM | POA: Diagnosis not present

## 2020-01-10 DIAGNOSIS — Z6823 Body mass index (BMI) 23.0-23.9, adult: Secondary | ICD-10-CM | POA: Diagnosis not present

## 2020-01-10 DIAGNOSIS — E7800 Pure hypercholesterolemia, unspecified: Secondary | ICD-10-CM | POA: Diagnosis not present

## 2020-01-10 DIAGNOSIS — Z1339 Encounter for screening examination for other mental health and behavioral disorders: Secondary | ICD-10-CM | POA: Diagnosis not present

## 2020-01-11 DIAGNOSIS — E7800 Pure hypercholesterolemia, unspecified: Secondary | ICD-10-CM | POA: Diagnosis not present

## 2020-01-11 DIAGNOSIS — Z125 Encounter for screening for malignant neoplasm of prostate: Secondary | ICD-10-CM | POA: Diagnosis not present

## 2020-01-11 DIAGNOSIS — R5383 Other fatigue: Secondary | ICD-10-CM | POA: Diagnosis not present

## 2020-01-11 DIAGNOSIS — Z79899 Other long term (current) drug therapy: Secondary | ICD-10-CM | POA: Diagnosis not present

## 2020-01-20 DIAGNOSIS — Z8551 Personal history of malignant neoplasm of bladder: Secondary | ICD-10-CM | POA: Diagnosis not present

## 2020-01-27 DIAGNOSIS — D649 Anemia, unspecified: Secondary | ICD-10-CM | POA: Diagnosis not present

## 2020-01-27 DIAGNOSIS — Z923 Personal history of irradiation: Secondary | ICD-10-CM | POA: Diagnosis not present

## 2020-01-27 DIAGNOSIS — R351 Nocturia: Secondary | ICD-10-CM | POA: Diagnosis not present

## 2020-01-27 DIAGNOSIS — C679 Malignant neoplasm of bladder, unspecified: Secondary | ICD-10-CM | POA: Diagnosis not present

## 2020-02-07 DIAGNOSIS — Z23 Encounter for immunization: Secondary | ICD-10-CM | POA: Diagnosis not present

## 2020-02-21 DIAGNOSIS — Z23 Encounter for immunization: Secondary | ICD-10-CM | POA: Diagnosis not present

## 2020-03-06 DIAGNOSIS — C679 Malignant neoplasm of bladder, unspecified: Secondary | ICD-10-CM | POA: Diagnosis not present

## 2020-03-06 DIAGNOSIS — C670 Malignant neoplasm of trigone of bladder: Secondary | ICD-10-CM | POA: Diagnosis not present

## 2020-03-06 DIAGNOSIS — D508 Other iron deficiency anemias: Secondary | ICD-10-CM | POA: Diagnosis not present

## 2020-03-07 DIAGNOSIS — K8020 Calculus of gallbladder without cholecystitis without obstruction: Secondary | ICD-10-CM | POA: Diagnosis not present

## 2020-03-07 DIAGNOSIS — C679 Malignant neoplasm of bladder, unspecified: Secondary | ICD-10-CM | POA: Diagnosis not present

## 2020-03-07 DIAGNOSIS — N281 Cyst of kidney, acquired: Secondary | ICD-10-CM | POA: Diagnosis not present

## 2020-03-07 DIAGNOSIS — I700 Atherosclerosis of aorta: Secondary | ICD-10-CM | POA: Diagnosis not present

## 2020-03-08 DIAGNOSIS — Z7189 Other specified counseling: Secondary | ICD-10-CM | POA: Diagnosis not present

## 2020-03-08 DIAGNOSIS — Z952 Presence of prosthetic heart valve: Secondary | ICD-10-CM | POA: Diagnosis not present

## 2020-03-08 DIAGNOSIS — R310 Gross hematuria: Secondary | ICD-10-CM | POA: Diagnosis not present

## 2020-03-08 DIAGNOSIS — D500 Iron deficiency anemia secondary to blood loss (chronic): Secondary | ICD-10-CM | POA: Diagnosis not present

## 2020-03-08 DIAGNOSIS — C679 Malignant neoplasm of bladder, unspecified: Secondary | ICD-10-CM | POA: Diagnosis not present

## 2020-03-14 ENCOUNTER — Other Ambulatory Visit (INDEPENDENT_AMBULATORY_CARE_PROVIDER_SITE_OTHER): Payer: Self-pay | Admitting: Vascular Surgery

## 2020-03-14 DIAGNOSIS — I129 Hypertensive chronic kidney disease with stage 1 through stage 4 chronic kidney disease, or unspecified chronic kidney disease: Secondary | ICD-10-CM | POA: Diagnosis not present

## 2020-03-14 DIAGNOSIS — K219 Gastro-esophageal reflux disease without esophagitis: Secondary | ICD-10-CM | POA: Diagnosis not present

## 2020-03-14 DIAGNOSIS — I6523 Occlusion and stenosis of bilateral carotid arteries: Secondary | ICD-10-CM

## 2020-03-14 DIAGNOSIS — I4891 Unspecified atrial fibrillation: Secondary | ICD-10-CM | POA: Diagnosis not present

## 2020-03-14 DIAGNOSIS — N183 Chronic kidney disease, stage 3 unspecified: Secondary | ICD-10-CM | POA: Diagnosis not present

## 2020-03-17 ENCOUNTER — Ambulatory Visit (INDEPENDENT_AMBULATORY_CARE_PROVIDER_SITE_OTHER): Payer: Medicare Other

## 2020-03-17 ENCOUNTER — Ambulatory Visit (INDEPENDENT_AMBULATORY_CARE_PROVIDER_SITE_OTHER): Payer: Medicare Other | Admitting: Vascular Surgery

## 2020-03-17 ENCOUNTER — Other Ambulatory Visit: Payer: Self-pay

## 2020-03-17 ENCOUNTER — Encounter (INDEPENDENT_AMBULATORY_CARE_PROVIDER_SITE_OTHER): Payer: Self-pay | Admitting: Vascular Surgery

## 2020-03-17 VITALS — BP 108/69 | HR 87 | Resp 16 | Wt 158.8 lb

## 2020-03-17 DIAGNOSIS — I10 Essential (primary) hypertension: Secondary | ICD-10-CM

## 2020-03-17 DIAGNOSIS — E1129 Type 2 diabetes mellitus with other diabetic kidney complication: Secondary | ICD-10-CM | POA: Insufficient documentation

## 2020-03-17 DIAGNOSIS — E785 Hyperlipidemia, unspecified: Secondary | ICD-10-CM

## 2020-03-17 DIAGNOSIS — Z952 Presence of prosthetic heart valve: Secondary | ICD-10-CM | POA: Insufficient documentation

## 2020-03-17 DIAGNOSIS — Z95818 Presence of other cardiac implants and grafts: Secondary | ICD-10-CM | POA: Insufficient documentation

## 2020-03-17 DIAGNOSIS — R809 Proteinuria, unspecified: Secondary | ICD-10-CM

## 2020-03-17 DIAGNOSIS — I6523 Occlusion and stenosis of bilateral carotid arteries: Secondary | ICD-10-CM

## 2020-03-17 DIAGNOSIS — D62 Acute posthemorrhagic anemia: Secondary | ICD-10-CM | POA: Insufficient documentation

## 2020-03-17 NOTE — Progress Notes (Signed)
MRN : [id number]  Bryan Christian is a 84 y.o. (12-23-1935) male who presents with chief complaint of  Chief Complaint  Patient presents with  . Follow-up    ultrasound follow up  .  History of Present Illness: Patient returns in follow-up of his carotid disease.  Since his last visit, he has had many issues not related to his carotid disease but his overall health.  He has had a percutaneous valve repair.  He has had bladder cancer treatment.  No stroke or TIA symptoms since his last visit although he does have a previous history of stroke from atrial fibrillation. His carotid duplex shows no change from his ultrasound a year ago where there is occlusion or near occlusion of the right internal carotid artery which has been present for over a decade.  The left carotid artery velocities are on the upper end of the 1 to 39% range.  Current Outpatient Medications  Medication Sig Dispense Refill  . amLODipine (NORVASC) 5 MG tablet     . aspirin 81 MG chewable tablet Chew by mouth daily.    Marland Kitchen azelastine (ASTELIN) 0.1 % nasal spray 2 sprays by Both Nostrils route 2 (two) times a day as needed.    . folic acid (FOLVITE) 1 MG tablet     . hydrochlorothiazide (HYDRODIURIL) 25 MG tablet     . iron polysaccharides (NIFEREX) 150 MG capsule Take 150 mg by mouth daily.    Marland Kitchen lisinopril (PRINIVIL,ZESTRIL) 40 MG tablet     . metoprolol succinate (TOPROL-XL) 25 MG 24 hr tablet Take by mouth.    . Omega-3 Fatty Acids (FISH OIL) 1000 MG CAPS Take by mouth daily.    Marland Kitchen omeprazole (PRILOSEC) 20 MG capsule     . rosuvastatin (CRESTOR) 20 MG tablet Take by mouth.    . tamsulosin (FLOMAX) 0.4 MG CAPS capsule TAKE 1 CAPSULE BY MOUTH ONCE DAILY TO HELP WITH URINE RETENTION  0  . atorvastatin (LIPITOR) 80 MG tablet  (Patient not taking: Reported on 03/17/2020)    . CIALIS 20 MG tablet  (Patient not taking: Reported on 10/01/2019)    . Rivaroxaban (XARELTO) 15 MG TABS tablet Take by mouth.     No current  facility-administered medications for this visit.    Past Medical History:  Diagnosis Date  . Cancer [facility])    bladder cancer  . Carotid artery occlusion   . Hyperlipidemia   . Hypertension   . Stroke [facility])     Past Surgical History:  Procedure Laterality Date  . CATARACT EXTRACTION, BILATERAL Bilateral      Social History   Tobacco Use  . Smoking status: Former Research scientist (life sciences)  . Smokeless tobacco: Never Used  Substance Use Topics  . Alcohol use: Yes  . Drug use: No     Family History  Problem Relation Age of Onset  . Cancer Father     No Known Allergies   REVIEW OF SYSTEMS (Negative unless checked)  Constitutional: [] Weight loss  [] Fever  [] Chills Cardiac: [] Chest pain   [] Chest pressure   [] Palpitations   [] Shortness of breath when laying flat   [] Shortness of breath at rest   [] Shortness of breath with exertion. Vascular:  [] Pain in legs with walking   [] Pain in legs at rest   [] Pain in legs when laying flat   [] Claudication   [] Pain in feet when walking  [] Pain in feet at rest  [] Pain in feet when laying flat   [] History of  DVT   [] Phlebitis   [] Swelling in legs   [] Varicose veins   [] Non-healing ulcers Pulmonary:   [] Uses home oxygen   [] Productive cough   [] Hemoptysis   [] Wheeze  [] COPD   [] Asthma Neurologic:  [] Dizziness  [] Blackouts   [] Seizures   [x] History of stroke   [] History of TIA  [] Aphasia   [] Temporary blindness   [] Dysphagia   [] Weakness or numbness in arms   [] Weakness or numbness in legs Musculoskeletal:  [x] Arthritis   [] Joint swelling   [] Joint pain   [] Low back pain Hematologic:  [] Easy bruising  [] Easy bleeding   [] Hypercoagulable state   [] Anemic  [] Hepatitis Gastrointestinal:  [] Blood in stool   [] Vomiting blood  [] Gastroesophageal reflux/heartburn   [] Difficulty swallowing. Genitourinary:  [] Chronic kidney disease   [] Difficult urination  [x] Frequent urination  [] Burning with urination   [] Blood in urine Skin:  [] Rashes   [] Ulcers    [] Wounds Psychological:  [] History of anxiety   []  History of major depression.  Physical Examination  Vitals:   03/17/20 0846  BP: 108/69  Pulse: 87  Resp: 16  Weight: 158 lb 12.8 oz (72 kg)   Body mass index is 22.79 kg/m. Gen:  WD/WN, NAD Head: [place]/AT, No temporalis wasting. Ear/Nose/Throat: Hearing grossly intact, nares w/o erythema or drainage, trachea midline Eyes: Conjunctiva clear. Sclera non-icteric Neck: Supple.  No bruit  Pulmonary:  Good air movement, equal and clear to auscultation bilaterally.  Cardiac: irregular Vascular:  Vessel Right Left  Radial Palpable Palpable       Musculoskeletal: M/S 5/5 throughout.  No deformity or atrophy. No edema. Neurologic: CN 2-12 intact. Sensation grossly intact in extremities.  Symmetrical.  Speech is fluent. Motor exam as listed above. Psychiatric: Judgment intact, Mood & affect appropriate for pt's clinical situation. Dermatologic: No rashes or ulcers noted.  No cellulitis or open wounds.      CBC No results found for: WBC, HGB, HCT, MCV, PLT  BMET No results found for: NA, K, CL, CO2, GLUCOSE, BUN, CREATININE, CALCIUM, GFRNONAA, GFRAA CrCl cannot be calculated (No successful lab value found.).  COAG No results found for: INR, PROTIME  Radiology No results found.   Assessment/Plan Hyperlipidemia lipid control important in reducing the progression of atherosclerotic disease. Continue statin therapy   Essential hypertension blood pressure control important in reducing the progression of atherosclerotic disease. On appropriate oral medications.  Carotid stenosis  His carotid duplex shows no change from his ultrasound a year ago where there is occlusion or near occlusion of the right internal carotid artery which has been present for over a decade.  The left carotid artery velocities are on the upper end of the 1 to 39% range. No change in plan of care.  No intervention. Recheck in one year.  Controlled type  2 diabetes mellitus with microalbuminuria, without long-term current use of insulin (HCC) blood glucose control important in reducing the progression of atherosclerotic disease. Also, involved in wound healing. On appropriate medications.     Leotis Pain, MD  03/17/2020 9:04 AM    This note was created with Dragon medical transcription system.  Any errors from dictation are purely unintentional

## 2020-03-17 NOTE — Assessment & Plan Note (Signed)
His carotid duplex shows no change from his ultrasound a year ago where there is occlusion or near occlusion of the right internal carotid artery which has been present for over a decade.  The left carotid artery velocities are on the upper end of the 1 to 39% range. No change in plan of care.  No intervention. Recheck in one year.

## 2020-03-17 NOTE — Assessment & Plan Note (Signed)
blood glucose control important in reducing the progression of atherosclerotic disease. Also, involved in wound healing. On appropriate medications.  

## 2020-03-21 DIAGNOSIS — C679 Malignant neoplasm of bladder, unspecified: Secondary | ICD-10-CM | POA: Diagnosis not present

## 2020-03-30 DIAGNOSIS — Z952 Presence of prosthetic heart valve: Secondary | ICD-10-CM | POA: Diagnosis not present

## 2020-03-30 DIAGNOSIS — I6523 Occlusion and stenosis of bilateral carotid arteries: Secondary | ICD-10-CM | POA: Diagnosis not present

## 2020-03-30 DIAGNOSIS — E782 Mixed hyperlipidemia: Secondary | ICD-10-CM | POA: Diagnosis not present

## 2020-03-30 DIAGNOSIS — I350 Nonrheumatic aortic (valve) stenosis: Secondary | ICD-10-CM | POA: Diagnosis not present

## 2020-03-30 DIAGNOSIS — I2510 Atherosclerotic heart disease of native coronary artery without angina pectoris: Secondary | ICD-10-CM | POA: Diagnosis not present

## 2020-03-30 DIAGNOSIS — I4821 Permanent atrial fibrillation: Secondary | ICD-10-CM | POA: Diagnosis not present

## 2020-03-30 DIAGNOSIS — I10 Essential (primary) hypertension: Secondary | ICD-10-CM | POA: Diagnosis not present

## 2020-04-26 DIAGNOSIS — E7800 Pure hypercholesterolemia, unspecified: Secondary | ICD-10-CM | POA: Diagnosis not present

## 2020-04-26 DIAGNOSIS — Z7982 Long term (current) use of aspirin: Secondary | ICD-10-CM | POA: Diagnosis not present

## 2020-04-26 DIAGNOSIS — Z801 Family history of malignant neoplasm of trachea, bronchus and lung: Secondary | ICD-10-CM | POA: Diagnosis not present

## 2020-04-26 DIAGNOSIS — E119 Type 2 diabetes mellitus without complications: Secondary | ICD-10-CM | POA: Diagnosis not present

## 2020-04-26 DIAGNOSIS — I10 Essential (primary) hypertension: Secondary | ICD-10-CM | POA: Diagnosis not present

## 2020-04-26 DIAGNOSIS — C679 Malignant neoplasm of bladder, unspecified: Secondary | ICD-10-CM | POA: Diagnosis not present

## 2020-04-26 DIAGNOSIS — I4891 Unspecified atrial fibrillation: Secondary | ICD-10-CM | POA: Diagnosis not present

## 2020-04-26 DIAGNOSIS — Z800 Family history of malignant neoplasm of digestive organs: Secondary | ICD-10-CM | POA: Diagnosis not present

## 2020-04-26 DIAGNOSIS — Z802 Family history of malignant neoplasm of other respiratory and intrathoracic organs: Secondary | ICD-10-CM | POA: Diagnosis not present

## 2020-04-26 DIAGNOSIS — Z952 Presence of prosthetic heart valve: Secondary | ICD-10-CM | POA: Diagnosis not present

## 2020-04-26 DIAGNOSIS — Z79899 Other long term (current) drug therapy: Secondary | ICD-10-CM | POA: Diagnosis not present

## 2020-04-26 DIAGNOSIS — Z7902 Long term (current) use of antithrombotics/antiplatelets: Secondary | ICD-10-CM | POA: Diagnosis not present

## 2020-05-11 DIAGNOSIS — Z6823 Body mass index (BMI) 23.0-23.9, adult: Secondary | ICD-10-CM | POA: Diagnosis not present

## 2020-05-11 DIAGNOSIS — Z87891 Personal history of nicotine dependence: Secondary | ICD-10-CM | POA: Diagnosis not present

## 2020-05-11 DIAGNOSIS — I4891 Unspecified atrial fibrillation: Secondary | ICD-10-CM | POA: Diagnosis not present

## 2020-05-11 DIAGNOSIS — N183 Chronic kidney disease, stage 3 unspecified: Secondary | ICD-10-CM | POA: Diagnosis not present

## 2020-05-11 DIAGNOSIS — I10 Essential (primary) hypertension: Secondary | ICD-10-CM | POA: Diagnosis not present

## 2020-05-11 DIAGNOSIS — I480 Paroxysmal atrial fibrillation: Secondary | ICD-10-CM | POA: Diagnosis not present

## 2020-05-11 DIAGNOSIS — Z299 Encounter for prophylactic measures, unspecified: Secondary | ICD-10-CM | POA: Diagnosis not present

## 2020-06-13 DIAGNOSIS — I10 Essential (primary) hypertension: Secondary | ICD-10-CM | POA: Diagnosis not present

## 2020-06-13 DIAGNOSIS — E1165 Type 2 diabetes mellitus with hyperglycemia: Secondary | ICD-10-CM | POA: Diagnosis not present

## 2020-06-13 DIAGNOSIS — Z299 Encounter for prophylactic measures, unspecified: Secondary | ICD-10-CM | POA: Diagnosis not present

## 2020-06-13 DIAGNOSIS — E1122 Type 2 diabetes mellitus with diabetic chronic kidney disease: Secondary | ICD-10-CM | POA: Diagnosis not present

## 2020-06-13 DIAGNOSIS — C679 Malignant neoplasm of bladder, unspecified: Secondary | ICD-10-CM | POA: Diagnosis not present

## 2020-06-13 DIAGNOSIS — I480 Paroxysmal atrial fibrillation: Secondary | ICD-10-CM | POA: Diagnosis not present

## 2020-06-22 DIAGNOSIS — C679 Malignant neoplasm of bladder, unspecified: Secondary | ICD-10-CM | POA: Diagnosis not present

## 2020-06-22 DIAGNOSIS — I708 Atherosclerosis of other arteries: Secondary | ICD-10-CM | POA: Diagnosis not present

## 2020-06-22 DIAGNOSIS — C670 Malignant neoplasm of trigone of bladder: Secondary | ICD-10-CM | POA: Diagnosis not present

## 2020-06-22 DIAGNOSIS — I723 Aneurysm of iliac artery: Secondary | ICD-10-CM | POA: Diagnosis not present

## 2020-06-22 DIAGNOSIS — K8020 Calculus of gallbladder without cholecystitis without obstruction: Secondary | ICD-10-CM | POA: Diagnosis not present

## 2020-06-30 DIAGNOSIS — C679 Malignant neoplasm of bladder, unspecified: Secondary | ICD-10-CM | POA: Diagnosis not present

## 2020-06-30 DIAGNOSIS — D508 Other iron deficiency anemias: Secondary | ICD-10-CM | POA: Diagnosis not present

## 2020-06-30 DIAGNOSIS — C670 Malignant neoplasm of trigone of bladder: Secondary | ICD-10-CM | POA: Diagnosis not present

## 2020-07-06 DIAGNOSIS — Z8551 Personal history of malignant neoplasm of bladder: Secondary | ICD-10-CM | POA: Diagnosis not present

## 2020-07-06 DIAGNOSIS — N3040 Irradiation cystitis without hematuria: Secondary | ICD-10-CM | POA: Diagnosis not present

## 2020-07-06 DIAGNOSIS — Z923 Personal history of irradiation: Secondary | ICD-10-CM | POA: Diagnosis not present

## 2020-07-06 DIAGNOSIS — E1129 Type 2 diabetes mellitus with other diabetic kidney complication: Secondary | ICD-10-CM | POA: Diagnosis not present

## 2020-07-06 DIAGNOSIS — Z08 Encounter for follow-up examination after completed treatment for malignant neoplasm: Secondary | ICD-10-CM | POA: Diagnosis not present

## 2020-07-06 DIAGNOSIS — Z7189 Other specified counseling: Secondary | ICD-10-CM | POA: Diagnosis not present

## 2020-07-06 DIAGNOSIS — R809 Proteinuria, unspecified: Secondary | ICD-10-CM | POA: Diagnosis not present

## 2020-07-06 DIAGNOSIS — C679 Malignant neoplasm of bladder, unspecified: Secondary | ICD-10-CM | POA: Diagnosis not present

## 2020-07-06 DIAGNOSIS — D500 Iron deficiency anemia secondary to blood loss (chronic): Secondary | ICD-10-CM | POA: Diagnosis not present

## 2020-07-13 DIAGNOSIS — E1165 Type 2 diabetes mellitus with hyperglycemia: Secondary | ICD-10-CM | POA: Diagnosis not present

## 2020-07-18 DIAGNOSIS — I350 Nonrheumatic aortic (valve) stenosis: Secondary | ICD-10-CM | POA: Diagnosis not present

## 2020-07-18 DIAGNOSIS — I4811 Longstanding persistent atrial fibrillation: Secondary | ICD-10-CM | POA: Diagnosis not present

## 2020-07-18 DIAGNOSIS — E782 Mixed hyperlipidemia: Secondary | ICD-10-CM | POA: Diagnosis not present

## 2020-07-18 DIAGNOSIS — I10 Essential (primary) hypertension: Secondary | ICD-10-CM | POA: Diagnosis not present

## 2020-07-18 DIAGNOSIS — I2510 Atherosclerotic heart disease of native coronary artery without angina pectoris: Secondary | ICD-10-CM | POA: Diagnosis not present

## 2020-07-25 DIAGNOSIS — E1122 Type 2 diabetes mellitus with diabetic chronic kidney disease: Secondary | ICD-10-CM | POA: Diagnosis not present

## 2020-07-25 DIAGNOSIS — E1165 Type 2 diabetes mellitus with hyperglycemia: Secondary | ICD-10-CM | POA: Diagnosis not present

## 2020-07-25 DIAGNOSIS — N183 Chronic kidney disease, stage 3 unspecified: Secondary | ICD-10-CM | POA: Diagnosis not present

## 2020-07-25 DIAGNOSIS — I4891 Unspecified atrial fibrillation: Secondary | ICD-10-CM | POA: Diagnosis not present

## 2020-07-25 DIAGNOSIS — I10 Essential (primary) hypertension: Secondary | ICD-10-CM | POA: Diagnosis not present

## 2020-07-25 DIAGNOSIS — Z299 Encounter for prophylactic measures, unspecified: Secondary | ICD-10-CM | POA: Diagnosis not present

## 2020-08-01 DIAGNOSIS — D649 Anemia, unspecified: Secondary | ICD-10-CM | POA: Diagnosis not present

## 2020-08-01 DIAGNOSIS — Z08 Encounter for follow-up examination after completed treatment for malignant neoplasm: Secondary | ICD-10-CM | POA: Diagnosis not present

## 2020-08-01 DIAGNOSIS — C679 Malignant neoplasm of bladder, unspecified: Secondary | ICD-10-CM | POA: Diagnosis not present

## 2020-08-01 DIAGNOSIS — Z8551 Personal history of malignant neoplasm of bladder: Secondary | ICD-10-CM | POA: Diagnosis not present

## 2020-08-11 DIAGNOSIS — E1165 Type 2 diabetes mellitus with hyperglycemia: Secondary | ICD-10-CM | POA: Diagnosis not present

## 2020-09-05 DIAGNOSIS — Z299 Encounter for prophylactic measures, unspecified: Secondary | ICD-10-CM | POA: Diagnosis not present

## 2020-09-05 DIAGNOSIS — R609 Edema, unspecified: Secondary | ICD-10-CM | POA: Diagnosis not present

## 2020-09-05 DIAGNOSIS — R0602 Shortness of breath: Secondary | ICD-10-CM | POA: Diagnosis not present

## 2020-09-05 DIAGNOSIS — I10 Essential (primary) hypertension: Secondary | ICD-10-CM | POA: Diagnosis not present

## 2020-09-12 DIAGNOSIS — Z23 Encounter for immunization: Secondary | ICD-10-CM | POA: Diagnosis not present

## 2020-09-12 DIAGNOSIS — E1165 Type 2 diabetes mellitus with hyperglycemia: Secondary | ICD-10-CM | POA: Diagnosis not present

## 2020-09-15 DIAGNOSIS — I10 Essential (primary) hypertension: Secondary | ICD-10-CM | POA: Diagnosis not present

## 2020-09-15 DIAGNOSIS — Z299 Encounter for prophylactic measures, unspecified: Secondary | ICD-10-CM | POA: Diagnosis not present

## 2020-09-15 DIAGNOSIS — E1122 Type 2 diabetes mellitus with diabetic chronic kidney disease: Secondary | ICD-10-CM | POA: Diagnosis not present

## 2020-09-15 DIAGNOSIS — I5032 Chronic diastolic (congestive) heart failure: Secondary | ICD-10-CM | POA: Diagnosis not present

## 2020-09-15 DIAGNOSIS — E1165 Type 2 diabetes mellitus with hyperglycemia: Secondary | ICD-10-CM | POA: Diagnosis not present

## 2020-09-15 DIAGNOSIS — N183 Chronic kidney disease, stage 3 unspecified: Secondary | ICD-10-CM | POA: Diagnosis not present

## 2020-09-18 DIAGNOSIS — R0602 Shortness of breath: Secondary | ICD-10-CM | POA: Diagnosis not present

## 2020-09-19 DIAGNOSIS — R3129 Other microscopic hematuria: Secondary | ICD-10-CM | POA: Diagnosis not present

## 2020-09-19 DIAGNOSIS — R808 Other proteinuria: Secondary | ICD-10-CM | POA: Diagnosis not present

## 2020-09-19 DIAGNOSIS — N3289 Other specified disorders of bladder: Secondary | ICD-10-CM | POA: Diagnosis not present

## 2020-09-19 DIAGNOSIS — Z8551 Personal history of malignant neoplasm of bladder: Secondary | ICD-10-CM | POA: Diagnosis not present

## 2020-09-28 DIAGNOSIS — H5789 Other specified disorders of eye and adnexa: Secondary | ICD-10-CM | POA: Diagnosis not present

## 2020-10-10 DIAGNOSIS — I10 Essential (primary) hypertension: Secondary | ICD-10-CM | POA: Diagnosis not present

## 2020-10-10 DIAGNOSIS — I350 Nonrheumatic aortic (valve) stenosis: Secondary | ICD-10-CM | POA: Diagnosis not present

## 2020-10-10 DIAGNOSIS — E782 Mixed hyperlipidemia: Secondary | ICD-10-CM | POA: Diagnosis not present

## 2020-10-10 DIAGNOSIS — I2510 Atherosclerotic heart disease of native coronary artery without angina pectoris: Secondary | ICD-10-CM | POA: Diagnosis not present

## 2020-10-10 DIAGNOSIS — I5032 Chronic diastolic (congestive) heart failure: Secondary | ICD-10-CM | POA: Diagnosis not present

## 2020-10-10 DIAGNOSIS — I4811 Longstanding persistent atrial fibrillation: Secondary | ICD-10-CM | POA: Diagnosis not present

## 2020-10-12 DIAGNOSIS — E1165 Type 2 diabetes mellitus with hyperglycemia: Secondary | ICD-10-CM | POA: Diagnosis not present

## 2020-10-25 DIAGNOSIS — I509 Heart failure, unspecified: Secondary | ICD-10-CM | POA: Diagnosis not present

## 2020-10-25 DIAGNOSIS — Z7984 Long term (current) use of oral hypoglycemic drugs: Secondary | ICD-10-CM | POA: Diagnosis not present

## 2020-10-25 DIAGNOSIS — C679 Malignant neoplasm of bladder, unspecified: Secondary | ICD-10-CM | POA: Diagnosis not present

## 2020-10-25 DIAGNOSIS — E1122 Type 2 diabetes mellitus with diabetic chronic kidney disease: Secondary | ICD-10-CM | POA: Diagnosis not present

## 2020-10-25 DIAGNOSIS — N400 Enlarged prostate without lower urinary tract symptoms: Secondary | ICD-10-CM | POA: Diagnosis not present

## 2020-10-25 DIAGNOSIS — I2510 Atherosclerotic heart disease of native coronary artery without angina pectoris: Secondary | ICD-10-CM | POA: Diagnosis not present

## 2020-10-25 DIAGNOSIS — I779 Disorder of arteries and arterioles, unspecified: Secondary | ICD-10-CM | POA: Diagnosis not present

## 2020-10-25 DIAGNOSIS — D509 Iron deficiency anemia, unspecified: Secondary | ICD-10-CM | POA: Diagnosis not present

## 2020-10-25 DIAGNOSIS — I110 Hypertensive heart disease with heart failure: Secondary | ICD-10-CM | POA: Diagnosis not present

## 2020-10-25 DIAGNOSIS — I4891 Unspecified atrial fibrillation: Secondary | ICD-10-CM | POA: Diagnosis not present

## 2020-10-25 DIAGNOSIS — Z79899 Other long term (current) drug therapy: Secondary | ICD-10-CM | POA: Diagnosis not present

## 2020-10-25 DIAGNOSIS — Z87891 Personal history of nicotine dependence: Secondary | ICD-10-CM | POA: Diagnosis not present

## 2020-10-25 DIAGNOSIS — K219 Gastro-esophageal reflux disease without esophagitis: Secondary | ICD-10-CM | POA: Diagnosis not present

## 2020-10-25 DIAGNOSIS — Z7982 Long term (current) use of aspirin: Secondary | ICD-10-CM | POA: Diagnosis not present

## 2020-10-25 DIAGNOSIS — Z8673 Personal history of transient ischemic attack (TIA), and cerebral infarction without residual deficits: Secondary | ICD-10-CM | POA: Diagnosis not present

## 2020-10-25 DIAGNOSIS — N189 Chronic kidney disease, unspecified: Secondary | ICD-10-CM | POA: Diagnosis not present

## 2020-10-25 DIAGNOSIS — Z7902 Long term (current) use of antithrombotics/antiplatelets: Secondary | ICD-10-CM | POA: Diagnosis not present

## 2020-10-25 DIAGNOSIS — J302 Other seasonal allergic rhinitis: Secondary | ICD-10-CM | POA: Diagnosis not present

## 2020-10-25 DIAGNOSIS — I129 Hypertensive chronic kidney disease with stage 1 through stage 4 chronic kidney disease, or unspecified chronic kidney disease: Secondary | ICD-10-CM | POA: Diagnosis not present

## 2020-10-27 DIAGNOSIS — Z299 Encounter for prophylactic measures, unspecified: Secondary | ICD-10-CM | POA: Diagnosis not present

## 2020-10-27 DIAGNOSIS — C679 Malignant neoplasm of bladder, unspecified: Secondary | ICD-10-CM | POA: Diagnosis not present

## 2020-10-27 DIAGNOSIS — I5032 Chronic diastolic (congestive) heart failure: Secondary | ICD-10-CM | POA: Diagnosis not present

## 2020-10-27 DIAGNOSIS — Z6824 Body mass index (BMI) 24.0-24.9, adult: Secondary | ICD-10-CM | POA: Diagnosis not present

## 2020-10-27 DIAGNOSIS — I480 Paroxysmal atrial fibrillation: Secondary | ICD-10-CM | POA: Diagnosis not present

## 2020-10-27 DIAGNOSIS — I10 Essential (primary) hypertension: Secondary | ICD-10-CM | POA: Diagnosis not present

## 2020-10-31 DIAGNOSIS — C670 Malignant neoplasm of trigone of bladder: Secondary | ICD-10-CM | POA: Diagnosis not present

## 2020-10-31 DIAGNOSIS — Z8551 Personal history of malignant neoplasm of bladder: Secondary | ICD-10-CM | POA: Diagnosis not present

## 2020-10-31 DIAGNOSIS — C679 Malignant neoplasm of bladder, unspecified: Secondary | ICD-10-CM | POA: Diagnosis not present

## 2020-10-31 DIAGNOSIS — Z08 Encounter for follow-up examination after completed treatment for malignant neoplasm: Secondary | ICD-10-CM | POA: Diagnosis not present

## 2020-10-31 DIAGNOSIS — D649 Anemia, unspecified: Secondary | ICD-10-CM | POA: Diagnosis not present

## 2020-11-02 DIAGNOSIS — Z87891 Personal history of nicotine dependence: Secondary | ICD-10-CM | POA: Diagnosis not present

## 2020-11-02 DIAGNOSIS — D303 Benign neoplasm of bladder: Secondary | ICD-10-CM | POA: Diagnosis not present

## 2020-11-02 DIAGNOSIS — I779 Disorder of arteries and arterioles, unspecified: Secondary | ICD-10-CM | POA: Diagnosis not present

## 2020-11-02 DIAGNOSIS — N3020 Other chronic cystitis without hematuria: Secondary | ICD-10-CM | POA: Diagnosis not present

## 2020-11-02 DIAGNOSIS — Z952 Presence of prosthetic heart valve: Secondary | ICD-10-CM | POA: Diagnosis not present

## 2020-11-02 DIAGNOSIS — R140 Abdominal distension (gaseous): Secondary | ICD-10-CM | POA: Diagnosis not present

## 2020-11-02 DIAGNOSIS — R339 Retention of urine, unspecified: Secondary | ICD-10-CM | POA: Diagnosis not present

## 2020-11-02 DIAGNOSIS — Z8673 Personal history of transient ischemic attack (TIA), and cerebral infarction without residual deficits: Secondary | ICD-10-CM | POA: Diagnosis not present

## 2020-11-02 DIAGNOSIS — R310 Gross hematuria: Secondary | ICD-10-CM | POA: Diagnosis not present

## 2020-11-02 DIAGNOSIS — I350 Nonrheumatic aortic (valve) stenosis: Secondary | ICD-10-CM | POA: Diagnosis not present

## 2020-11-02 DIAGNOSIS — Z8551 Personal history of malignant neoplasm of bladder: Secondary | ICD-10-CM | POA: Diagnosis not present

## 2020-11-02 DIAGNOSIS — Z9889 Other specified postprocedural states: Secondary | ICD-10-CM | POA: Diagnosis not present

## 2020-11-02 DIAGNOSIS — C679 Malignant neoplasm of bladder, unspecified: Secondary | ICD-10-CM | POA: Diagnosis not present

## 2020-11-02 DIAGNOSIS — Z7902 Long term (current) use of antithrombotics/antiplatelets: Secondary | ICD-10-CM | POA: Diagnosis not present

## 2020-11-02 DIAGNOSIS — I2510 Atherosclerotic heart disease of native coronary artery without angina pectoris: Secondary | ICD-10-CM | POA: Diagnosis not present

## 2020-11-02 DIAGNOSIS — Z7982 Long term (current) use of aspirin: Secondary | ICD-10-CM | POA: Diagnosis not present

## 2020-11-02 DIAGNOSIS — R109 Unspecified abdominal pain: Secondary | ICD-10-CM | POA: Diagnosis not present

## 2020-11-02 DIAGNOSIS — E7800 Pure hypercholesterolemia, unspecified: Secondary | ICD-10-CM | POA: Diagnosis not present

## 2020-11-02 DIAGNOSIS — I4891 Unspecified atrial fibrillation: Secondary | ICD-10-CM | POA: Diagnosis not present

## 2020-11-02 DIAGNOSIS — N3000 Acute cystitis without hematuria: Secondary | ICD-10-CM | POA: Diagnosis not present

## 2020-11-02 DIAGNOSIS — E119 Type 2 diabetes mellitus without complications: Secondary | ICD-10-CM | POA: Diagnosis not present

## 2020-11-02 DIAGNOSIS — R338 Other retention of urine: Secondary | ICD-10-CM | POA: Diagnosis not present

## 2020-11-02 DIAGNOSIS — I110 Hypertensive heart disease with heart failure: Secondary | ICD-10-CM | POA: Diagnosis not present

## 2020-11-02 DIAGNOSIS — I509 Heart failure, unspecified: Secondary | ICD-10-CM | POA: Diagnosis not present

## 2020-11-02 DIAGNOSIS — I10 Essential (primary) hypertension: Secondary | ICD-10-CM | POA: Diagnosis not present

## 2020-11-02 DIAGNOSIS — Z79899 Other long term (current) drug therapy: Secondary | ICD-10-CM | POA: Diagnosis not present

## 2020-11-07 DIAGNOSIS — I10 Essential (primary) hypertension: Secondary | ICD-10-CM | POA: Diagnosis not present

## 2020-11-07 DIAGNOSIS — C679 Malignant neoplasm of bladder, unspecified: Secondary | ICD-10-CM | POA: Diagnosis not present

## 2020-11-07 DIAGNOSIS — Z299 Encounter for prophylactic measures, unspecified: Secondary | ICD-10-CM | POA: Diagnosis not present

## 2020-11-07 DIAGNOSIS — I5032 Chronic diastolic (congestive) heart failure: Secondary | ICD-10-CM | POA: Diagnosis not present

## 2020-11-10 DIAGNOSIS — E1165 Type 2 diabetes mellitus with hyperglycemia: Secondary | ICD-10-CM | POA: Diagnosis not present

## 2020-11-27 DIAGNOSIS — C679 Malignant neoplasm of bladder, unspecified: Secondary | ICD-10-CM | POA: Diagnosis not present

## 2020-12-13 DIAGNOSIS — E1165 Type 2 diabetes mellitus with hyperglycemia: Secondary | ICD-10-CM | POA: Diagnosis not present

## 2020-12-19 DIAGNOSIS — E1122 Type 2 diabetes mellitus with diabetic chronic kidney disease: Secondary | ICD-10-CM | POA: Diagnosis not present

## 2020-12-19 DIAGNOSIS — E1165 Type 2 diabetes mellitus with hyperglycemia: Secondary | ICD-10-CM | POA: Diagnosis not present

## 2020-12-19 DIAGNOSIS — N183 Chronic kidney disease, stage 3 unspecified: Secondary | ICD-10-CM | POA: Diagnosis not present

## 2020-12-19 DIAGNOSIS — Z299 Encounter for prophylactic measures, unspecified: Secondary | ICD-10-CM | POA: Diagnosis not present

## 2020-12-19 DIAGNOSIS — I10 Essential (primary) hypertension: Secondary | ICD-10-CM | POA: Diagnosis not present

## 2021-01-01 DIAGNOSIS — C679 Malignant neoplasm of bladder, unspecified: Secondary | ICD-10-CM | POA: Diagnosis not present

## 2021-01-01 DIAGNOSIS — D508 Other iron deficiency anemias: Secondary | ICD-10-CM | POA: Diagnosis not present

## 2021-01-01 DIAGNOSIS — C670 Malignant neoplasm of trigone of bladder: Secondary | ICD-10-CM | POA: Diagnosis not present

## 2021-01-11 DIAGNOSIS — K769 Liver disease, unspecified: Secondary | ICD-10-CM | POA: Diagnosis not present

## 2021-01-11 DIAGNOSIS — Z1331 Encounter for screening for depression: Secondary | ICD-10-CM | POA: Diagnosis not present

## 2021-01-11 DIAGNOSIS — K8020 Calculus of gallbladder without cholecystitis without obstruction: Secondary | ICD-10-CM | POA: Diagnosis not present

## 2021-01-11 DIAGNOSIS — Z6823 Body mass index (BMI) 23.0-23.9, adult: Secondary | ICD-10-CM | POA: Diagnosis not present

## 2021-01-11 DIAGNOSIS — I517 Cardiomegaly: Secondary | ICD-10-CM | POA: Diagnosis not present

## 2021-01-11 DIAGNOSIS — Z23 Encounter for immunization: Secondary | ICD-10-CM | POA: Diagnosis not present

## 2021-01-11 DIAGNOSIS — I2510 Atherosclerotic heart disease of native coronary artery without angina pectoris: Secondary | ICD-10-CM | POA: Diagnosis not present

## 2021-01-11 DIAGNOSIS — E7800 Pure hypercholesterolemia, unspecified: Secondary | ICD-10-CM | POA: Diagnosis not present

## 2021-01-11 DIAGNOSIS — R601 Generalized edema: Secondary | ICD-10-CM | POA: Diagnosis not present

## 2021-01-11 DIAGNOSIS — R109 Unspecified abdominal pain: Secondary | ICD-10-CM | POA: Diagnosis not present

## 2021-01-11 DIAGNOSIS — R591 Generalized enlarged lymph nodes: Secondary | ICD-10-CM | POA: Diagnosis not present

## 2021-01-11 DIAGNOSIS — Z0000 Encounter for general adult medical examination without abnormal findings: Secondary | ICD-10-CM | POA: Diagnosis not present

## 2021-01-11 DIAGNOSIS — I700 Atherosclerosis of aorta: Secondary | ICD-10-CM | POA: Diagnosis not present

## 2021-01-11 DIAGNOSIS — R5383 Other fatigue: Secondary | ICD-10-CM | POA: Diagnosis not present

## 2021-01-11 DIAGNOSIS — Z7189 Other specified counseling: Secondary | ICD-10-CM | POA: Diagnosis not present

## 2021-01-11 DIAGNOSIS — D649 Anemia, unspecified: Secondary | ICD-10-CM | POA: Diagnosis not present

## 2021-01-11 DIAGNOSIS — C679 Malignant neoplasm of bladder, unspecified: Secondary | ICD-10-CM | POA: Diagnosis not present

## 2021-01-11 DIAGNOSIS — Z299 Encounter for prophylactic measures, unspecified: Secondary | ICD-10-CM | POA: Diagnosis not present

## 2021-01-11 DIAGNOSIS — R590 Localized enlarged lymph nodes: Secondary | ICD-10-CM | POA: Diagnosis not present

## 2021-01-11 DIAGNOSIS — I10 Essential (primary) hypertension: Secondary | ICD-10-CM | POA: Diagnosis not present

## 2021-01-11 DIAGNOSIS — R918 Other nonspecific abnormal finding of lung field: Secondary | ICD-10-CM | POA: Diagnosis not present

## 2021-01-12 DIAGNOSIS — E1165 Type 2 diabetes mellitus with hyperglycemia: Secondary | ICD-10-CM | POA: Diagnosis not present

## 2021-01-16 DIAGNOSIS — E782 Mixed hyperlipidemia: Secondary | ICD-10-CM | POA: Diagnosis not present

## 2021-01-16 DIAGNOSIS — I4811 Longstanding persistent atrial fibrillation: Secondary | ICD-10-CM | POA: Diagnosis not present

## 2021-01-16 DIAGNOSIS — I2510 Atherosclerotic heart disease of native coronary artery without angina pectoris: Secondary | ICD-10-CM | POA: Diagnosis not present

## 2021-01-16 DIAGNOSIS — I350 Nonrheumatic aortic (valve) stenosis: Secondary | ICD-10-CM | POA: Diagnosis not present

## 2021-01-16 DIAGNOSIS — I5032 Chronic diastolic (congestive) heart failure: Secondary | ICD-10-CM | POA: Diagnosis not present

## 2021-01-17 DIAGNOSIS — C679 Malignant neoplasm of bladder, unspecified: Secondary | ICD-10-CM | POA: Diagnosis not present

## 2021-01-22 DIAGNOSIS — C679 Malignant neoplasm of bladder, unspecified: Secondary | ICD-10-CM | POA: Diagnosis not present

## 2021-01-22 DIAGNOSIS — C670 Malignant neoplasm of trigone of bladder: Secondary | ICD-10-CM | POA: Diagnosis not present

## 2021-01-26 DIAGNOSIS — C679 Malignant neoplasm of bladder, unspecified: Secondary | ICD-10-CM | POA: Diagnosis not present

## 2021-01-26 DIAGNOSIS — C670 Malignant neoplasm of trigone of bladder: Secondary | ICD-10-CM | POA: Diagnosis not present

## 2021-01-29 DIAGNOSIS — I517 Cardiomegaly: Secondary | ICD-10-CM | POA: Diagnosis not present

## 2021-01-29 DIAGNOSIS — C679 Malignant neoplasm of bladder, unspecified: Secondary | ICD-10-CM | POA: Diagnosis not present

## 2021-01-29 DIAGNOSIS — I2510 Atherosclerotic heart disease of native coronary artery without angina pectoris: Secondary | ICD-10-CM | POA: Diagnosis not present

## 2021-01-29 DIAGNOSIS — C670 Malignant neoplasm of trigone of bladder: Secondary | ICD-10-CM | POA: Diagnosis not present

## 2021-01-29 DIAGNOSIS — I350 Nonrheumatic aortic (valve) stenosis: Secondary | ICD-10-CM | POA: Diagnosis not present

## 2021-01-29 DIAGNOSIS — I10 Essential (primary) hypertension: Secondary | ICD-10-CM | POA: Diagnosis not present

## 2021-01-29 DIAGNOSIS — Z87891 Personal history of nicotine dependence: Secondary | ICD-10-CM | POA: Diagnosis not present

## 2021-01-29 DIAGNOSIS — E7800 Pure hypercholesterolemia, unspecified: Secondary | ICD-10-CM | POA: Diagnosis not present

## 2021-01-29 DIAGNOSIS — E119 Type 2 diabetes mellitus without complications: Secondary | ICD-10-CM | POA: Diagnosis not present

## 2021-01-29 DIAGNOSIS — Z7982 Long term (current) use of aspirin: Secondary | ICD-10-CM | POA: Diagnosis not present

## 2021-01-29 DIAGNOSIS — Z7902 Long term (current) use of antithrombotics/antiplatelets: Secondary | ICD-10-CM | POA: Diagnosis not present

## 2021-02-07 DIAGNOSIS — R918 Other nonspecific abnormal finding of lung field: Secondary | ICD-10-CM | POA: Diagnosis not present

## 2021-02-07 DIAGNOSIS — R590 Localized enlarged lymph nodes: Secondary | ICD-10-CM | POA: Diagnosis not present

## 2021-02-07 DIAGNOSIS — N5089 Other specified disorders of the male genital organs: Secondary | ICD-10-CM | POA: Diagnosis not present

## 2021-02-07 DIAGNOSIS — C670 Malignant neoplasm of trigone of bladder: Secondary | ICD-10-CM | POA: Diagnosis not present

## 2021-02-07 DIAGNOSIS — R600 Localized edema: Secondary | ICD-10-CM | POA: Diagnosis not present

## 2021-02-07 DIAGNOSIS — K769 Liver disease, unspecified: Secondary | ICD-10-CM | POA: Diagnosis not present

## 2021-02-08 DIAGNOSIS — I5032 Chronic diastolic (congestive) heart failure: Secondary | ICD-10-CM | POA: Diagnosis not present

## 2021-02-08 DIAGNOSIS — Z299 Encounter for prophylactic measures, unspecified: Secondary | ICD-10-CM | POA: Diagnosis not present

## 2021-02-08 DIAGNOSIS — R109 Unspecified abdominal pain: Secondary | ICD-10-CM | POA: Diagnosis not present

## 2021-02-08 DIAGNOSIS — C670 Malignant neoplasm of trigone of bladder: Secondary | ICD-10-CM | POA: Diagnosis not present

## 2021-02-08 DIAGNOSIS — I10 Essential (primary) hypertension: Secondary | ICD-10-CM | POA: Diagnosis not present

## 2021-02-09 DIAGNOSIS — Z8551 Personal history of malignant neoplasm of bladder: Secondary | ICD-10-CM | POA: Diagnosis not present

## 2021-02-09 DIAGNOSIS — C670 Malignant neoplasm of trigone of bladder: Secondary | ICD-10-CM | POA: Diagnosis not present

## 2021-02-09 DIAGNOSIS — Z7982 Long term (current) use of aspirin: Secondary | ICD-10-CM | POA: Diagnosis not present

## 2021-02-09 DIAGNOSIS — R591 Generalized enlarged lymph nodes: Secondary | ICD-10-CM | POA: Diagnosis not present

## 2021-02-09 DIAGNOSIS — Z79899 Other long term (current) drug therapy: Secondary | ICD-10-CM | POA: Diagnosis not present

## 2021-02-09 DIAGNOSIS — Z7902 Long term (current) use of antithrombotics/antiplatelets: Secondary | ICD-10-CM | POA: Diagnosis not present

## 2021-02-09 DIAGNOSIS — R590 Localized enlarged lymph nodes: Secondary | ICD-10-CM | POA: Diagnosis not present

## 2021-02-09 DIAGNOSIS — C774 Secondary and unspecified malignant neoplasm of inguinal and lower limb lymph nodes: Secondary | ICD-10-CM | POA: Diagnosis not present

## 2021-02-12 DIAGNOSIS — E1165 Type 2 diabetes mellitus with hyperglycemia: Secondary | ICD-10-CM | POA: Diagnosis not present

## 2021-02-16 DIAGNOSIS — C679 Malignant neoplasm of bladder, unspecified: Secondary | ICD-10-CM | POA: Diagnosis not present

## 2021-02-19 DIAGNOSIS — C679 Malignant neoplasm of bladder, unspecified: Secondary | ICD-10-CM | POA: Diagnosis not present

## 2021-02-26 DIAGNOSIS — R5383 Other fatigue: Secondary | ICD-10-CM | POA: Diagnosis not present

## 2021-02-26 DIAGNOSIS — Z1329 Encounter for screening for other suspected endocrine disorder: Secondary | ICD-10-CM | POA: Diagnosis not present

## 2021-02-26 DIAGNOSIS — C670 Malignant neoplasm of trigone of bladder: Secondary | ICD-10-CM | POA: Diagnosis not present

## 2021-03-01 DIAGNOSIS — Z95828 Presence of other vascular implants and grafts: Secondary | ICD-10-CM | POA: Diagnosis not present

## 2021-03-01 DIAGNOSIS — C670 Malignant neoplasm of trigone of bladder: Secondary | ICD-10-CM | POA: Diagnosis not present

## 2021-03-01 DIAGNOSIS — Z5112 Encounter for antineoplastic immunotherapy: Secondary | ICD-10-CM | POA: Diagnosis not present

## 2021-03-01 DIAGNOSIS — D8989 Other specified disorders involving the immune mechanism, not elsewhere classified: Secondary | ICD-10-CM | POA: Diagnosis not present

## 2021-03-06 DIAGNOSIS — C670 Malignant neoplasm of trigone of bladder: Secondary | ICD-10-CM | POA: Diagnosis not present

## 2021-03-07 DIAGNOSIS — C670 Malignant neoplasm of trigone of bladder: Secondary | ICD-10-CM | POA: Diagnosis not present

## 2021-03-10 DIAGNOSIS — C670 Malignant neoplasm of trigone of bladder: Secondary | ICD-10-CM | POA: Diagnosis not present

## 2021-03-14 DIAGNOSIS — Z5112 Encounter for antineoplastic immunotherapy: Secondary | ICD-10-CM | POA: Diagnosis not present

## 2021-03-14 DIAGNOSIS — E1165 Type 2 diabetes mellitus with hyperglycemia: Secondary | ICD-10-CM | POA: Diagnosis not present

## 2021-03-14 DIAGNOSIS — Z08 Encounter for follow-up examination after completed treatment for malignant neoplasm: Secondary | ICD-10-CM | POA: Diagnosis not present

## 2021-03-14 DIAGNOSIS — C670 Malignant neoplasm of trigone of bladder: Secondary | ICD-10-CM | POA: Diagnosis not present

## 2021-03-14 DIAGNOSIS — R7989 Other specified abnormal findings of blood chemistry: Secondary | ICD-10-CM | POA: Diagnosis not present

## 2021-03-14 DIAGNOSIS — Z8551 Personal history of malignant neoplasm of bladder: Secondary | ICD-10-CM | POA: Diagnosis not present

## 2021-03-15 DIAGNOSIS — D8989 Other specified disorders involving the immune mechanism, not elsewhere classified: Secondary | ICD-10-CM | POA: Diagnosis not present

## 2021-03-15 DIAGNOSIS — Z95828 Presence of other vascular implants and grafts: Secondary | ICD-10-CM | POA: Diagnosis not present

## 2021-03-15 DIAGNOSIS — C670 Malignant neoplasm of trigone of bladder: Secondary | ICD-10-CM | POA: Diagnosis not present

## 2021-03-15 DIAGNOSIS — Z5112 Encounter for antineoplastic immunotherapy: Secondary | ICD-10-CM | POA: Diagnosis not present

## 2021-03-16 ENCOUNTER — Encounter (INDEPENDENT_AMBULATORY_CARE_PROVIDER_SITE_OTHER): Payer: Medicare Other

## 2021-03-16 ENCOUNTER — Ambulatory Visit (INDEPENDENT_AMBULATORY_CARE_PROVIDER_SITE_OTHER): Payer: Medicare Other | Admitting: Vascular Surgery

## 2021-03-19 DIAGNOSIS — C670 Malignant neoplasm of trigone of bladder: Secondary | ICD-10-CM | POA: Diagnosis not present

## 2021-03-28 DIAGNOSIS — C670 Malignant neoplasm of trigone of bladder: Secondary | ICD-10-CM | POA: Diagnosis not present

## 2021-03-28 DIAGNOSIS — N179 Acute kidney failure, unspecified: Secondary | ICD-10-CM | POA: Diagnosis not present

## 2021-03-28 DIAGNOSIS — N189 Chronic kidney disease, unspecified: Secondary | ICD-10-CM | POA: Diagnosis not present

## 2021-03-29 DIAGNOSIS — Z95828 Presence of other vascular implants and grafts: Secondary | ICD-10-CM | POA: Diagnosis not present

## 2021-03-29 DIAGNOSIS — Z5112 Encounter for antineoplastic immunotherapy: Secondary | ICD-10-CM | POA: Diagnosis not present

## 2021-03-29 DIAGNOSIS — C670 Malignant neoplasm of trigone of bladder: Secondary | ICD-10-CM | POA: Diagnosis not present

## 2021-03-30 DIAGNOSIS — N189 Chronic kidney disease, unspecified: Secondary | ICD-10-CM | POA: Diagnosis not present

## 2021-03-30 DIAGNOSIS — N170 Acute kidney failure with tubular necrosis: Secondary | ICD-10-CM | POA: Diagnosis not present

## 2021-03-30 DIAGNOSIS — N281 Cyst of kidney, acquired: Secondary | ICD-10-CM | POA: Diagnosis not present

## 2021-03-30 DIAGNOSIS — C760 Malignant neoplasm of head, face and neck: Secondary | ICD-10-CM | POA: Diagnosis not present

## 2021-04-12 DIAGNOSIS — I2510 Atherosclerotic heart disease of native coronary artery without angina pectoris: Secondary | ICD-10-CM | POA: Diagnosis not present

## 2021-04-12 DIAGNOSIS — E1129 Type 2 diabetes mellitus with other diabetic kidney complication: Secondary | ICD-10-CM | POA: Diagnosis not present

## 2021-04-12 DIAGNOSIS — R809 Proteinuria, unspecified: Secondary | ICD-10-CM | POA: Diagnosis not present

## 2021-04-12 DIAGNOSIS — D8989 Other specified disorders involving the immune mechanism, not elsewhere classified: Secondary | ICD-10-CM | POA: Diagnosis not present

## 2021-04-12 DIAGNOSIS — Z1159 Encounter for screening for other viral diseases: Secondary | ICD-10-CM | POA: Diagnosis not present

## 2021-04-12 DIAGNOSIS — C670 Malignant neoplasm of trigone of bladder: Secondary | ICD-10-CM | POA: Diagnosis not present

## 2021-04-13 DIAGNOSIS — Z5111 Encounter for antineoplastic chemotherapy: Secondary | ICD-10-CM | POA: Diagnosis not present

## 2021-04-13 DIAGNOSIS — E1165 Type 2 diabetes mellitus with hyperglycemia: Secondary | ICD-10-CM | POA: Diagnosis not present

## 2021-04-13 DIAGNOSIS — Z95828 Presence of other vascular implants and grafts: Secondary | ICD-10-CM | POA: Diagnosis not present

## 2021-04-13 DIAGNOSIS — C670 Malignant neoplasm of trigone of bladder: Secondary | ICD-10-CM | POA: Diagnosis not present

## 2021-04-17 DIAGNOSIS — Z952 Presence of prosthetic heart valve: Secondary | ICD-10-CM | POA: Diagnosis not present

## 2021-04-17 DIAGNOSIS — I4811 Longstanding persistent atrial fibrillation: Secondary | ICD-10-CM | POA: Diagnosis not present

## 2021-04-17 DIAGNOSIS — I10 Essential (primary) hypertension: Secondary | ICD-10-CM | POA: Diagnosis not present

## 2021-04-17 DIAGNOSIS — E782 Mixed hyperlipidemia: Secondary | ICD-10-CM | POA: Diagnosis not present

## 2021-04-20 DIAGNOSIS — C670 Malignant neoplasm of trigone of bladder: Secondary | ICD-10-CM | POA: Diagnosis not present

## 2021-04-23 DIAGNOSIS — C670 Malignant neoplasm of trigone of bladder: Secondary | ICD-10-CM | POA: Diagnosis not present

## 2021-04-23 DIAGNOSIS — Z95828 Presence of other vascular implants and grafts: Secondary | ICD-10-CM | POA: Diagnosis not present

## 2021-04-23 DIAGNOSIS — Z5111 Encounter for antineoplastic chemotherapy: Secondary | ICD-10-CM | POA: Diagnosis not present

## 2021-04-24 ENCOUNTER — Encounter (INDEPENDENT_AMBULATORY_CARE_PROVIDER_SITE_OTHER): Payer: Medicare Other

## 2021-04-24 ENCOUNTER — Ambulatory Visit (INDEPENDENT_AMBULATORY_CARE_PROVIDER_SITE_OTHER): Payer: Medicare Other | Admitting: Vascular Surgery

## 2021-05-04 DIAGNOSIS — C670 Malignant neoplasm of trigone of bladder: Secondary | ICD-10-CM | POA: Diagnosis not present

## 2021-05-07 DIAGNOSIS — C670 Malignant neoplasm of trigone of bladder: Secondary | ICD-10-CM | POA: Diagnosis not present

## 2021-05-07 DIAGNOSIS — Z5112 Encounter for antineoplastic immunotherapy: Secondary | ICD-10-CM | POA: Diagnosis not present

## 2021-05-07 DIAGNOSIS — Z95828 Presence of other vascular implants and grafts: Secondary | ICD-10-CM | POA: Diagnosis not present

## 2021-05-08 DIAGNOSIS — I10 Essential (primary) hypertension: Secondary | ICD-10-CM | POA: Diagnosis not present

## 2021-05-08 DIAGNOSIS — Z87891 Personal history of nicotine dependence: Secondary | ICD-10-CM | POA: Diagnosis not present

## 2021-05-08 DIAGNOSIS — E1122 Type 2 diabetes mellitus with diabetic chronic kidney disease: Secondary | ICD-10-CM | POA: Diagnosis not present

## 2021-05-08 DIAGNOSIS — E1165 Type 2 diabetes mellitus with hyperglycemia: Secondary | ICD-10-CM | POA: Diagnosis not present

## 2021-05-08 DIAGNOSIS — C679 Malignant neoplasm of bladder, unspecified: Secondary | ICD-10-CM | POA: Diagnosis not present

## 2021-05-08 DIAGNOSIS — Z299 Encounter for prophylactic measures, unspecified: Secondary | ICD-10-CM | POA: Diagnosis not present

## 2021-05-08 DIAGNOSIS — Z6824 Body mass index (BMI) 24.0-24.9, adult: Secondary | ICD-10-CM | POA: Diagnosis not present

## 2021-05-08 DIAGNOSIS — I6521 Occlusion and stenosis of right carotid artery: Secondary | ICD-10-CM | POA: Diagnosis not present

## 2021-05-13 DIAGNOSIS — E1165 Type 2 diabetes mellitus with hyperglycemia: Secondary | ICD-10-CM | POA: Diagnosis not present

## 2021-05-14 DIAGNOSIS — I6503 Occlusion and stenosis of bilateral vertebral arteries: Secondary | ICD-10-CM | POA: Diagnosis not present

## 2021-05-14 DIAGNOSIS — D171 Benign lipomatous neoplasm of skin and subcutaneous tissue of trunk: Secondary | ICD-10-CM | POA: Diagnosis not present

## 2021-05-14 DIAGNOSIS — J984 Other disorders of lung: Secondary | ICD-10-CM | POA: Diagnosis not present

## 2021-05-14 DIAGNOSIS — C679 Malignant neoplasm of bladder, unspecified: Secondary | ICD-10-CM | POA: Diagnosis not present

## 2021-05-14 DIAGNOSIS — I6523 Occlusion and stenosis of bilateral carotid arteries: Secondary | ICD-10-CM | POA: Diagnosis not present

## 2021-05-14 DIAGNOSIS — I2510 Atherosclerotic heart disease of native coronary artery without angina pectoris: Secondary | ICD-10-CM | POA: Diagnosis not present

## 2021-05-14 DIAGNOSIS — N2889 Other specified disorders of kidney and ureter: Secondary | ICD-10-CM | POA: Diagnosis not present

## 2021-05-14 DIAGNOSIS — J929 Pleural plaque without asbestos: Secondary | ICD-10-CM | POA: Diagnosis not present

## 2021-05-14 DIAGNOSIS — C670 Malignant neoplasm of trigone of bladder: Secondary | ICD-10-CM | POA: Diagnosis not present

## 2021-05-14 DIAGNOSIS — K8020 Calculus of gallbladder without cholecystitis without obstruction: Secondary | ICD-10-CM | POA: Diagnosis not present

## 2021-05-17 DIAGNOSIS — I10 Essential (primary) hypertension: Secondary | ICD-10-CM | POA: Diagnosis not present

## 2021-05-17 DIAGNOSIS — I6503 Occlusion and stenosis of bilateral vertebral arteries: Secondary | ICD-10-CM | POA: Diagnosis not present

## 2021-05-17 DIAGNOSIS — C670 Malignant neoplasm of trigone of bladder: Secondary | ICD-10-CM | POA: Diagnosis not present

## 2021-05-17 DIAGNOSIS — I6523 Occlusion and stenosis of bilateral carotid arteries: Secondary | ICD-10-CM | POA: Diagnosis not present

## 2021-05-17 DIAGNOSIS — Z8551 Personal history of malignant neoplasm of bladder: Secondary | ICD-10-CM | POA: Diagnosis not present

## 2021-05-22 DIAGNOSIS — Z8551 Personal history of malignant neoplasm of bladder: Secondary | ICD-10-CM | POA: Diagnosis not present

## 2021-05-25 DIAGNOSIS — I350 Nonrheumatic aortic (valve) stenosis: Secondary | ICD-10-CM | POA: Diagnosis not present

## 2021-05-25 DIAGNOSIS — C670 Malignant neoplasm of trigone of bladder: Secondary | ICD-10-CM | POA: Diagnosis not present

## 2021-05-25 IMAGING — RF DG SWALLOWING FUNCTION
12 of 24 series · 12 of 24 positions shown · non-contrast
Comparison: None

CLINICAL DATA: Dysphagia. Cough/GE reflux disease/other secondary
diagnosis

EXAM:
MODIFIED BARIUM SWALLOW
TECHNIQUE: Different consistencies of barium were administered orally to the
patient by the Speech Pathologist. Imaging of the pharynx was
performed in the lateral projection. The radiologist was present in
the fluoroscopy room for this study, providing personal supervision.
FLUOROSCOPY TIME:  Fluoroscopy Time:  3 minutes 54 seconds
Radiation Exposure Index (if provided by the fluoroscopic device):
18.2 mGy
Number of Acquired Spot Images: 0

[Series 2: tsp thin · 1 of 260 frames shown (1 of 2)]
[frame 1/260]
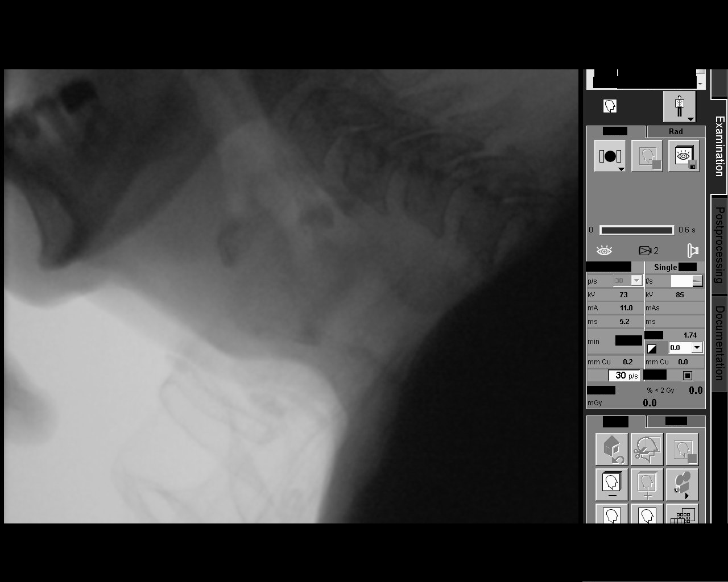

[Series 4: tsp thin · 1 of 236 frames shown (2 of 2)]
[frame 36/236]
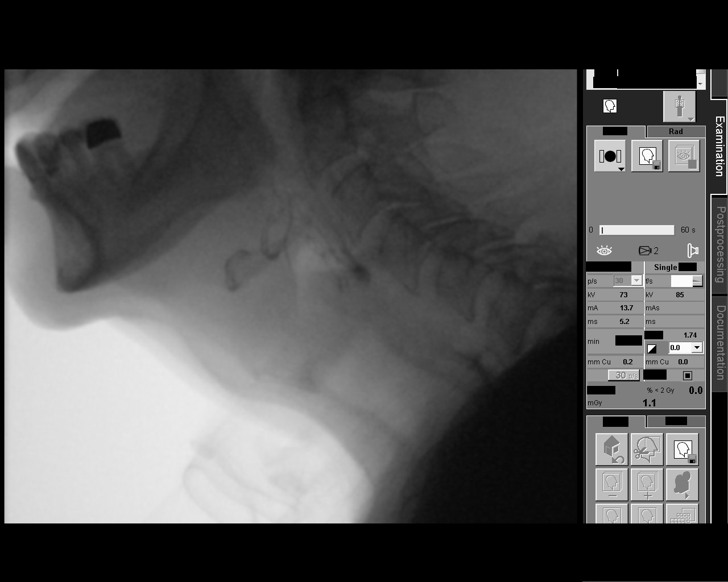

[Series 6: straw thin · 1 of 443 frames shown]
[frame 222/443]
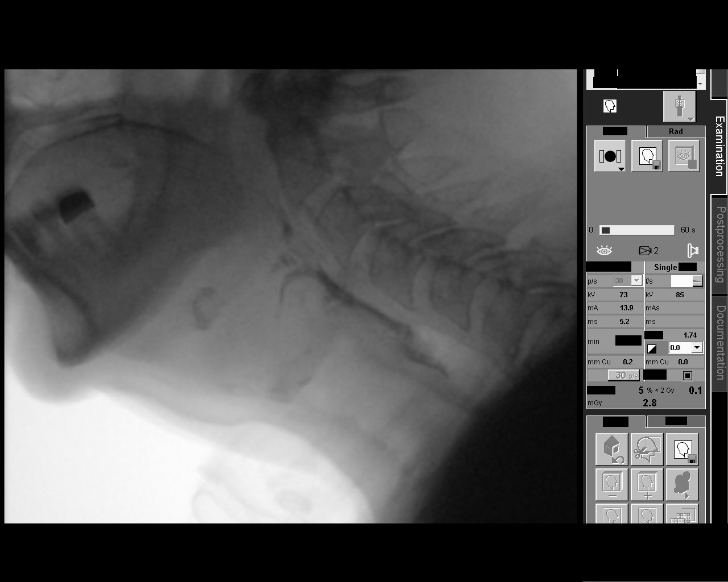

[Series 8: chin tuck thin · 1 of 208 frames shown (1 of 3)]
[frame 105/208]
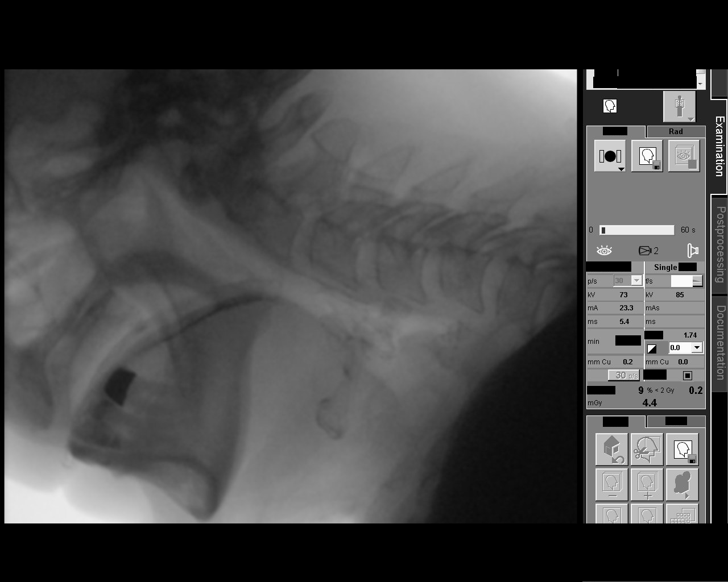

[Series 10: chin tuck thin · 1 of 158 frames shown (2 of 3)]
[frame 78/158]
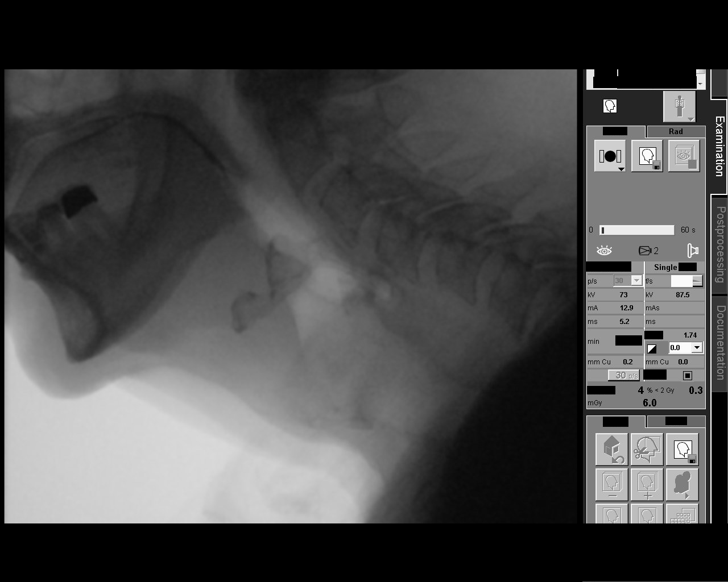

[Series 12: puree · 1 of 114 frames shown]
[frame 58/114]
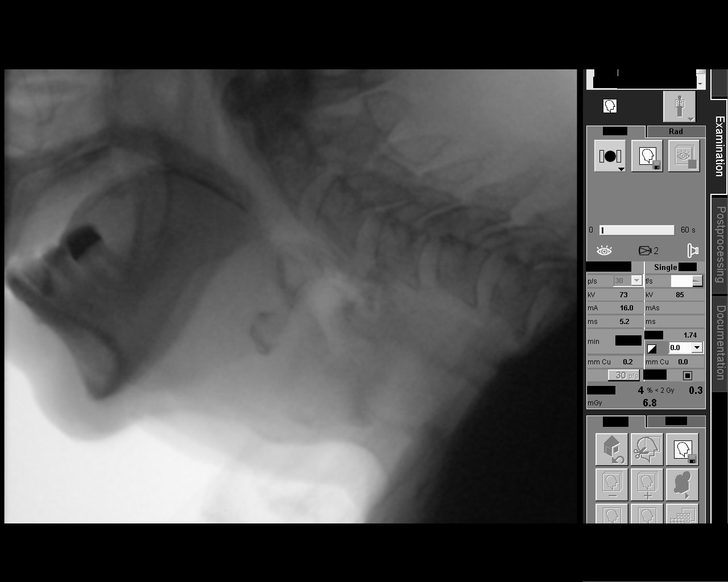

[Series 14: effortful swallow thin · 1 of 230 frames shown]
[frame 116/230]
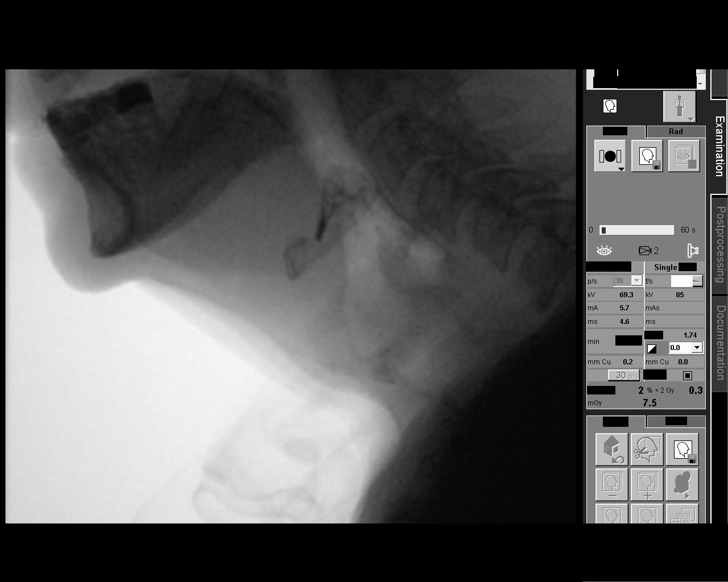

[Series 16: regular · 1 of 377 frames shown]
[frame 321/377]
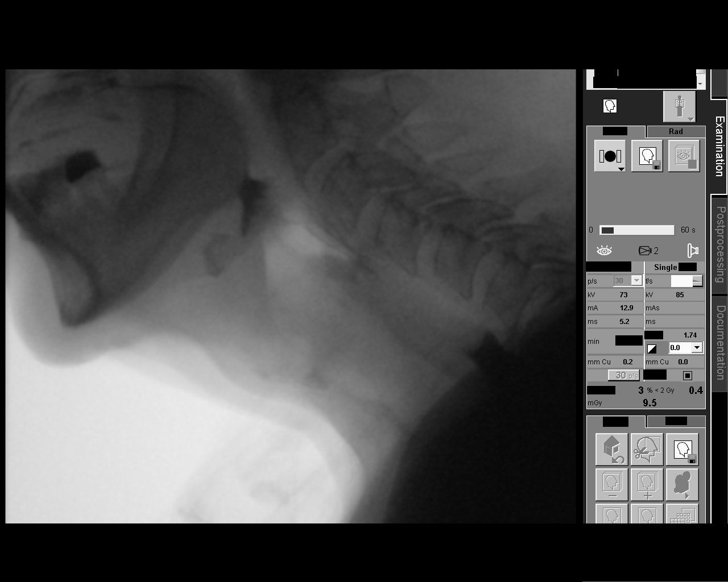

[Series 18: chin tuck thin · 1 of 240 frames shown (3 of 3)]
[frame 121/240]
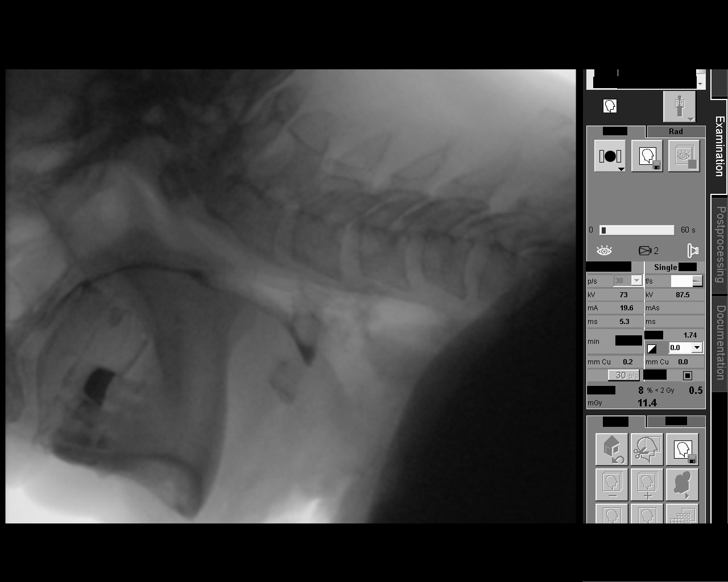

[Series 20: barium tab puree · 1 of 291 frames shown]
[frame 146/291]
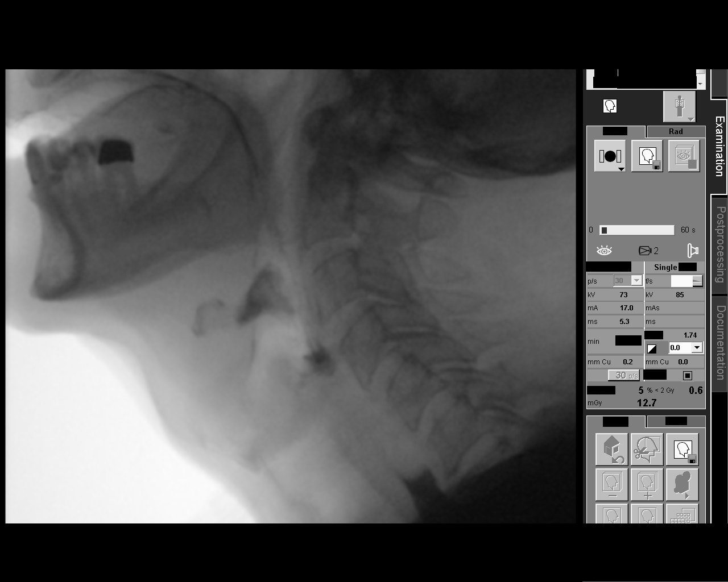

[Series 22: thin chin tuck · 1 of 213 frames shown]
[frame 208/213]
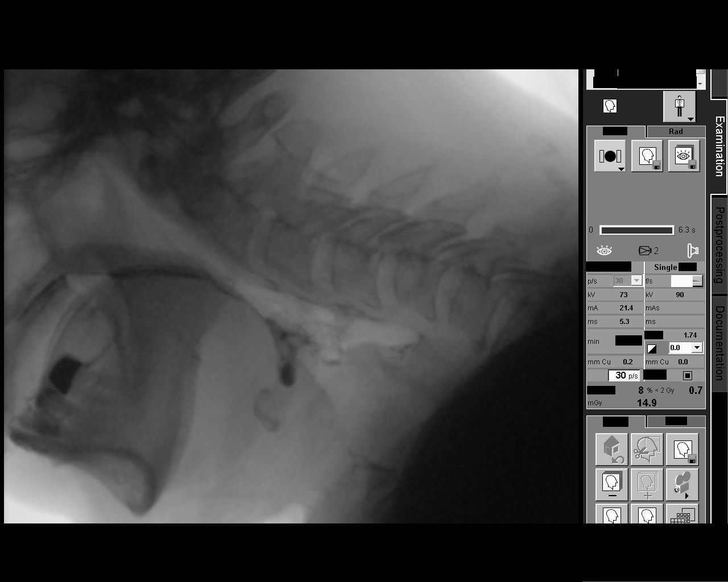

[Series 24: ntl · 1 of 232 frames shown]
[frame 198/232]
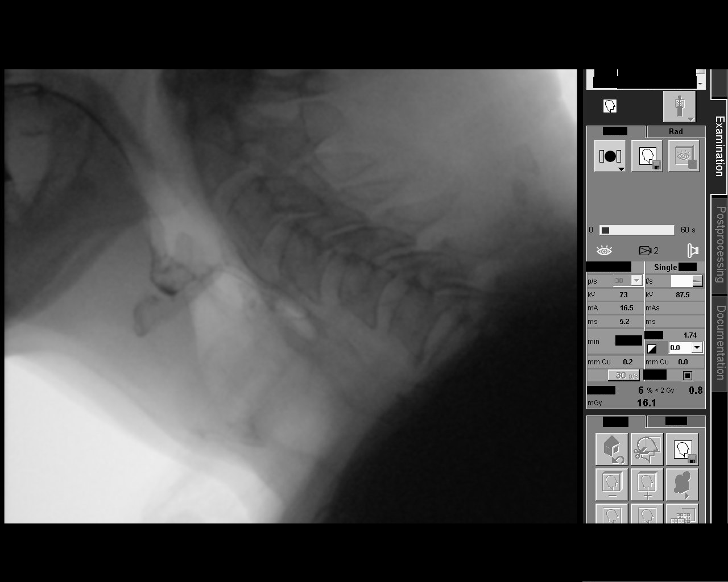

[12 of 24 positions shown; findings below may reference images not displayed]

FINDINGS: With thin barium by cup, patient demonstrated multiple episodes of
laryngeal penetration. Mild aspiration of contrast occurred with
sequential swallows by straw and when swallowing a 12.5 mm diameter
barium tablet with thin barium. Laryngeal penetration persisted
despite chin-tuck maneuvers. A single episode of aspiration below
the vocal cords occurred with chin tuck. No spontaneous cough reflex
on several of the episodes of aspiration, though a spontaneous cough
was seen with the largest volume of aspiration.

Laryngeal penetration occurred with nectar consistency barium
without aspiration.

Vallecular residuals with applesauce without laryngeal penetration
or aspiration. Similar findings with cracker consistency. Patient
was able swallow a an additional 12.5 mm diameter barium tablet in
applesauce without aspiration.
IMPRESSION: Swallowing dysfunction as above.

Please refer to the Speech Pathologists report for complete details
and recommendations.

## 2021-05-28 DIAGNOSIS — Z95828 Presence of other vascular implants and grafts: Secondary | ICD-10-CM | POA: Diagnosis not present

## 2021-05-28 DIAGNOSIS — C670 Malignant neoplasm of trigone of bladder: Secondary | ICD-10-CM | POA: Diagnosis not present

## 2021-05-28 DIAGNOSIS — Z5112 Encounter for antineoplastic immunotherapy: Secondary | ICD-10-CM | POA: Diagnosis not present

## 2021-06-01 DIAGNOSIS — C670 Malignant neoplasm of trigone of bladder: Secondary | ICD-10-CM | POA: Diagnosis not present

## 2021-06-04 DIAGNOSIS — Z5111 Encounter for antineoplastic chemotherapy: Secondary | ICD-10-CM | POA: Diagnosis not present

## 2021-06-04 DIAGNOSIS — C670 Malignant neoplasm of trigone of bladder: Secondary | ICD-10-CM | POA: Diagnosis not present

## 2021-06-12 DIAGNOSIS — E1165 Type 2 diabetes mellitus with hyperglycemia: Secondary | ICD-10-CM | POA: Diagnosis not present

## 2021-06-15 DIAGNOSIS — R809 Proteinuria, unspecified: Secondary | ICD-10-CM | POA: Diagnosis not present

## 2021-06-15 DIAGNOSIS — E1129 Type 2 diabetes mellitus with other diabetic kidney complication: Secondary | ICD-10-CM | POA: Diagnosis not present

## 2021-06-15 DIAGNOSIS — D508 Other iron deficiency anemias: Secondary | ICD-10-CM | POA: Diagnosis not present

## 2021-06-15 DIAGNOSIS — I158 Other secondary hypertension: Secondary | ICD-10-CM | POA: Diagnosis not present

## 2021-06-15 DIAGNOSIS — I350 Nonrheumatic aortic (valve) stenosis: Secondary | ICD-10-CM | POA: Diagnosis not present

## 2021-06-15 DIAGNOSIS — C670 Malignant neoplasm of trigone of bladder: Secondary | ICD-10-CM | POA: Diagnosis not present

## 2021-06-18 DIAGNOSIS — Z5112 Encounter for antineoplastic immunotherapy: Secondary | ICD-10-CM | POA: Diagnosis not present

## 2021-06-18 DIAGNOSIS — I5032 Chronic diastolic (congestive) heart failure: Secondary | ICD-10-CM | POA: Diagnosis not present

## 2021-06-18 DIAGNOSIS — C670 Malignant neoplasm of trigone of bladder: Secondary | ICD-10-CM | POA: Diagnosis not present

## 2021-06-18 DIAGNOSIS — Z6823 Body mass index (BMI) 23.0-23.9, adult: Secondary | ICD-10-CM | POA: Diagnosis not present

## 2021-06-18 DIAGNOSIS — Z299 Encounter for prophylactic measures, unspecified: Secondary | ICD-10-CM | POA: Diagnosis not present

## 2021-06-18 DIAGNOSIS — I10 Essential (primary) hypertension: Secondary | ICD-10-CM | POA: Diagnosis not present

## 2021-06-18 DIAGNOSIS — Z87891 Personal history of nicotine dependence: Secondary | ICD-10-CM | POA: Diagnosis not present

## 2021-06-18 DIAGNOSIS — E1165 Type 2 diabetes mellitus with hyperglycemia: Secondary | ICD-10-CM | POA: Diagnosis not present

## 2021-06-25 DIAGNOSIS — E7849 Other hyperlipidemia: Secondary | ICD-10-CM | POA: Diagnosis not present

## 2021-06-25 DIAGNOSIS — D8989 Other specified disorders involving the immune mechanism, not elsewhere classified: Secondary | ICD-10-CM | POA: Diagnosis not present

## 2021-06-25 DIAGNOSIS — C670 Malignant neoplasm of trigone of bladder: Secondary | ICD-10-CM | POA: Diagnosis not present

## 2021-06-25 DIAGNOSIS — I158 Other secondary hypertension: Secondary | ICD-10-CM | POA: Diagnosis not present

## 2021-06-25 DIAGNOSIS — D508 Other iron deficiency anemias: Secondary | ICD-10-CM | POA: Diagnosis not present

## 2021-06-25 DIAGNOSIS — I4891 Unspecified atrial fibrillation: Secondary | ICD-10-CM | POA: Diagnosis not present

## 2021-06-25 DIAGNOSIS — I9589 Other hypotension: Secondary | ICD-10-CM | POA: Diagnosis not present

## 2021-06-25 DIAGNOSIS — I10 Essential (primary) hypertension: Secondary | ICD-10-CM | POA: Diagnosis not present

## 2021-06-26 DIAGNOSIS — Z5111 Encounter for antineoplastic chemotherapy: Secondary | ICD-10-CM | POA: Diagnosis not present

## 2021-06-26 DIAGNOSIS — C670 Malignant neoplasm of trigone of bladder: Secondary | ICD-10-CM | POA: Diagnosis not present

## 2021-07-09 DIAGNOSIS — I158 Other secondary hypertension: Secondary | ICD-10-CM | POA: Diagnosis not present

## 2021-07-09 DIAGNOSIS — D8989 Other specified disorders involving the immune mechanism, not elsewhere classified: Secondary | ICD-10-CM | POA: Diagnosis not present

## 2021-07-09 DIAGNOSIS — Z8551 Personal history of malignant neoplasm of bladder: Secondary | ICD-10-CM | POA: Diagnosis not present

## 2021-07-09 DIAGNOSIS — R7989 Other specified abnormal findings of blood chemistry: Secondary | ICD-10-CM | POA: Diagnosis not present

## 2021-07-09 DIAGNOSIS — I9589 Other hypotension: Secondary | ICD-10-CM | POA: Diagnosis not present

## 2021-07-09 DIAGNOSIS — R310 Gross hematuria: Secondary | ICD-10-CM | POA: Diagnosis not present

## 2021-07-09 DIAGNOSIS — R350 Frequency of micturition: Secondary | ICD-10-CM | POA: Diagnosis not present

## 2021-07-09 DIAGNOSIS — D508 Other iron deficiency anemias: Secondary | ICD-10-CM | POA: Diagnosis not present

## 2021-07-09 DIAGNOSIS — I350 Nonrheumatic aortic (valve) stenosis: Secondary | ICD-10-CM | POA: Diagnosis not present

## 2021-07-09 DIAGNOSIS — I10 Essential (primary) hypertension: Secondary | ICD-10-CM | POA: Diagnosis not present

## 2021-07-09 DIAGNOSIS — Z08 Encounter for follow-up examination after completed treatment for malignant neoplasm: Secondary | ICD-10-CM | POA: Diagnosis not present

## 2021-07-09 DIAGNOSIS — C670 Malignant neoplasm of trigone of bladder: Secondary | ICD-10-CM | POA: Diagnosis not present

## 2021-07-10 DIAGNOSIS — Z7902 Long term (current) use of antithrombotics/antiplatelets: Secondary | ICD-10-CM | POA: Diagnosis not present

## 2021-07-10 DIAGNOSIS — Z7984 Long term (current) use of oral hypoglycemic drugs: Secondary | ICD-10-CM | POA: Diagnosis not present

## 2021-07-10 DIAGNOSIS — C670 Malignant neoplasm of trigone of bladder: Secondary | ICD-10-CM | POA: Diagnosis not present

## 2021-07-10 DIAGNOSIS — C774 Secondary and unspecified malignant neoplasm of inguinal and lower limb lymph nodes: Secondary | ICD-10-CM | POA: Diagnosis not present

## 2021-07-10 DIAGNOSIS — C787 Secondary malignant neoplasm of liver and intrahepatic bile duct: Secondary | ICD-10-CM | POA: Diagnosis not present

## 2021-07-10 DIAGNOSIS — N189 Chronic kidney disease, unspecified: Secondary | ICD-10-CM | POA: Diagnosis not present

## 2021-07-10 DIAGNOSIS — Z8674 Personal history of sudden cardiac arrest: Secondary | ICD-10-CM | POA: Diagnosis not present

## 2021-07-10 DIAGNOSIS — I2510 Atherosclerotic heart disease of native coronary artery without angina pectoris: Secondary | ICD-10-CM | POA: Diagnosis not present

## 2021-07-10 DIAGNOSIS — I129 Hypertensive chronic kidney disease with stage 1 through stage 4 chronic kidney disease, or unspecified chronic kidney disease: Secondary | ICD-10-CM | POA: Diagnosis not present

## 2021-07-10 DIAGNOSIS — Z952 Presence of prosthetic heart valve: Secondary | ICD-10-CM | POA: Diagnosis not present

## 2021-07-10 DIAGNOSIS — Z5111 Encounter for antineoplastic chemotherapy: Secondary | ICD-10-CM | POA: Diagnosis not present

## 2021-07-10 DIAGNOSIS — E7800 Pure hypercholesterolemia, unspecified: Secondary | ICD-10-CM | POA: Diagnosis not present

## 2021-07-10 DIAGNOSIS — Z79899 Other long term (current) drug therapy: Secondary | ICD-10-CM | POA: Diagnosis not present

## 2021-07-10 DIAGNOSIS — I4891 Unspecified atrial fibrillation: Secondary | ICD-10-CM | POA: Diagnosis not present

## 2021-07-10 DIAGNOSIS — E1122 Type 2 diabetes mellitus with diabetic chronic kidney disease: Secondary | ICD-10-CM | POA: Diagnosis not present

## 2021-07-10 DIAGNOSIS — Z8673 Personal history of transient ischemic attack (TIA), and cerebral infarction without residual deficits: Secondary | ICD-10-CM | POA: Diagnosis not present

## 2021-07-10 DIAGNOSIS — C772 Secondary and unspecified malignant neoplasm of intra-abdominal lymph nodes: Secondary | ICD-10-CM | POA: Diagnosis not present

## 2021-07-10 DIAGNOSIS — Z7982 Long term (current) use of aspirin: Secondary | ICD-10-CM | POA: Diagnosis not present

## 2021-07-10 DIAGNOSIS — E1165 Type 2 diabetes mellitus with hyperglycemia: Secondary | ICD-10-CM | POA: Diagnosis not present

## 2021-07-12 DIAGNOSIS — E1165 Type 2 diabetes mellitus with hyperglycemia: Secondary | ICD-10-CM | POA: Diagnosis not present

## 2021-07-16 DIAGNOSIS — D508 Other iron deficiency anemias: Secondary | ICD-10-CM | POA: Diagnosis not present

## 2021-07-16 DIAGNOSIS — E7849 Other hyperlipidemia: Secondary | ICD-10-CM | POA: Diagnosis not present

## 2021-07-16 DIAGNOSIS — C670 Malignant neoplasm of trigone of bladder: Secondary | ICD-10-CM | POA: Diagnosis not present

## 2021-07-16 DIAGNOSIS — I158 Other secondary hypertension: Secondary | ICD-10-CM | POA: Diagnosis not present

## 2021-07-16 DIAGNOSIS — R7989 Other specified abnormal findings of blood chemistry: Secondary | ICD-10-CM | POA: Diagnosis not present

## 2021-07-16 DIAGNOSIS — I9589 Other hypotension: Secondary | ICD-10-CM | POA: Diagnosis not present

## 2021-07-17 DIAGNOSIS — Z5111 Encounter for antineoplastic chemotherapy: Secondary | ICD-10-CM | POA: Diagnosis not present

## 2021-07-17 DIAGNOSIS — C670 Malignant neoplasm of trigone of bladder: Secondary | ICD-10-CM | POA: Diagnosis not present

## 2021-07-17 DIAGNOSIS — Z95828 Presence of other vascular implants and grafts: Secondary | ICD-10-CM | POA: Diagnosis not present

## 2021-07-27 DIAGNOSIS — E7849 Other hyperlipidemia: Secondary | ICD-10-CM | POA: Diagnosis not present

## 2021-07-27 DIAGNOSIS — R310 Gross hematuria: Secondary | ICD-10-CM | POA: Diagnosis not present

## 2021-07-27 DIAGNOSIS — R809 Proteinuria, unspecified: Secondary | ICD-10-CM | POA: Diagnosis not present

## 2021-07-27 DIAGNOSIS — I350 Nonrheumatic aortic (valve) stenosis: Secondary | ICD-10-CM | POA: Diagnosis not present

## 2021-07-27 DIAGNOSIS — R350 Frequency of micturition: Secondary | ICD-10-CM | POA: Diagnosis not present

## 2021-07-27 DIAGNOSIS — C670 Malignant neoplasm of trigone of bladder: Secondary | ICD-10-CM | POA: Diagnosis not present

## 2021-07-27 DIAGNOSIS — E1129 Type 2 diabetes mellitus with other diabetic kidney complication: Secondary | ICD-10-CM | POA: Diagnosis not present

## 2021-07-30 DIAGNOSIS — C670 Malignant neoplasm of trigone of bladder: Secondary | ICD-10-CM | POA: Diagnosis not present

## 2021-07-30 DIAGNOSIS — Z95828 Presence of other vascular implants and grafts: Secondary | ICD-10-CM | POA: Diagnosis not present

## 2021-08-06 DIAGNOSIS — Z08 Encounter for follow-up examination after completed treatment for malignant neoplasm: Secondary | ICD-10-CM | POA: Diagnosis not present

## 2021-08-06 DIAGNOSIS — C670 Malignant neoplasm of trigone of bladder: Secondary | ICD-10-CM | POA: Diagnosis not present

## 2021-08-06 DIAGNOSIS — Z8551 Personal history of malignant neoplasm of bladder: Secondary | ICD-10-CM | POA: Diagnosis not present

## 2021-08-07 DIAGNOSIS — E1122 Type 2 diabetes mellitus with diabetic chronic kidney disease: Secondary | ICD-10-CM | POA: Diagnosis not present

## 2021-08-07 DIAGNOSIS — Z7982 Long term (current) use of aspirin: Secondary | ICD-10-CM | POA: Diagnosis not present

## 2021-08-07 DIAGNOSIS — I4891 Unspecified atrial fibrillation: Secondary | ICD-10-CM | POA: Diagnosis not present

## 2021-08-07 DIAGNOSIS — Z8673 Personal history of transient ischemic attack (TIA), and cerebral infarction without residual deficits: Secondary | ICD-10-CM | POA: Diagnosis not present

## 2021-08-07 DIAGNOSIS — Z8674 Personal history of sudden cardiac arrest: Secondary | ICD-10-CM | POA: Diagnosis not present

## 2021-08-07 DIAGNOSIS — C679 Malignant neoplasm of bladder, unspecified: Secondary | ICD-10-CM | POA: Diagnosis not present

## 2021-08-07 DIAGNOSIS — E1165 Type 2 diabetes mellitus with hyperglycemia: Secondary | ICD-10-CM | POA: Diagnosis not present

## 2021-08-07 DIAGNOSIS — I129 Hypertensive chronic kidney disease with stage 1 through stage 4 chronic kidney disease, or unspecified chronic kidney disease: Secondary | ICD-10-CM | POA: Diagnosis not present

## 2021-08-07 DIAGNOSIS — I6529 Occlusion and stenosis of unspecified carotid artery: Secondary | ICD-10-CM | POA: Diagnosis not present

## 2021-08-07 DIAGNOSIS — Z5111 Encounter for antineoplastic chemotherapy: Secondary | ICD-10-CM | POA: Diagnosis not present

## 2021-08-07 DIAGNOSIS — I2510 Atherosclerotic heart disease of native coronary artery without angina pectoris: Secondary | ICD-10-CM | POA: Diagnosis not present

## 2021-08-07 DIAGNOSIS — Z7901 Long term (current) use of anticoagulants: Secondary | ICD-10-CM | POA: Diagnosis not present

## 2021-08-07 DIAGNOSIS — E7800 Pure hypercholesterolemia, unspecified: Secondary | ICD-10-CM | POA: Diagnosis not present

## 2021-08-07 DIAGNOSIS — K769 Liver disease, unspecified: Secondary | ICD-10-CM | POA: Diagnosis not present

## 2021-08-07 DIAGNOSIS — C774 Secondary and unspecified malignant neoplasm of inguinal and lower limb lymph nodes: Secondary | ICD-10-CM | POA: Diagnosis not present

## 2021-08-07 DIAGNOSIS — Z79899 Other long term (current) drug therapy: Secondary | ICD-10-CM | POA: Diagnosis not present

## 2021-08-07 DIAGNOSIS — C670 Malignant neoplasm of trigone of bladder: Secondary | ICD-10-CM | POA: Diagnosis not present

## 2021-08-07 DIAGNOSIS — Z7984 Long term (current) use of oral hypoglycemic drugs: Secondary | ICD-10-CM | POA: Diagnosis not present

## 2021-08-07 DIAGNOSIS — C7989 Secondary malignant neoplasm of other specified sites: Secondary | ICD-10-CM | POA: Diagnosis not present

## 2021-08-07 DIAGNOSIS — Z952 Presence of prosthetic heart valve: Secondary | ICD-10-CM | POA: Diagnosis not present

## 2021-08-07 DIAGNOSIS — N189 Chronic kidney disease, unspecified: Secondary | ICD-10-CM | POA: Diagnosis not present

## 2021-08-08 DIAGNOSIS — C670 Malignant neoplasm of trigone of bladder: Secondary | ICD-10-CM | POA: Diagnosis not present

## 2021-08-09 DIAGNOSIS — C799 Secondary malignant neoplasm of unspecified site: Secondary | ICD-10-CM | POA: Diagnosis not present

## 2021-08-09 DIAGNOSIS — E1165 Type 2 diabetes mellitus with hyperglycemia: Secondary | ICD-10-CM | POA: Diagnosis not present

## 2021-08-09 DIAGNOSIS — E1129 Type 2 diabetes mellitus with other diabetic kidney complication: Secondary | ICD-10-CM | POA: Diagnosis not present

## 2021-08-09 DIAGNOSIS — Z299 Encounter for prophylactic measures, unspecified: Secondary | ICD-10-CM | POA: Diagnosis not present

## 2021-08-09 DIAGNOSIS — E1122 Type 2 diabetes mellitus with diabetic chronic kidney disease: Secondary | ICD-10-CM | POA: Diagnosis not present

## 2021-08-09 DIAGNOSIS — C679 Malignant neoplasm of bladder, unspecified: Secondary | ICD-10-CM | POA: Diagnosis not present

## 2021-08-09 DIAGNOSIS — I10 Essential (primary) hypertension: Secondary | ICD-10-CM | POA: Diagnosis not present

## 2021-08-12 DIAGNOSIS — E1165 Type 2 diabetes mellitus with hyperglycemia: Secondary | ICD-10-CM | POA: Diagnosis not present

## 2021-08-20 DIAGNOSIS — N5089 Other specified disorders of the male genital organs: Secondary | ICD-10-CM | POA: Diagnosis not present

## 2021-08-20 DIAGNOSIS — N3289 Other specified disorders of bladder: Secondary | ICD-10-CM | POA: Diagnosis not present

## 2021-08-20 DIAGNOSIS — J929 Pleural plaque without asbestos: Secondary | ICD-10-CM | POA: Diagnosis not present

## 2021-08-20 DIAGNOSIS — I2510 Atherosclerotic heart disease of native coronary artery without angina pectoris: Secondary | ICD-10-CM | POA: Diagnosis not present

## 2021-08-20 DIAGNOSIS — C7989 Secondary malignant neoplasm of other specified sites: Secondary | ICD-10-CM | POA: Diagnosis not present

## 2021-08-20 DIAGNOSIS — K8020 Calculus of gallbladder without cholecystitis without obstruction: Secondary | ICD-10-CM | POA: Diagnosis not present

## 2021-08-20 DIAGNOSIS — D171 Benign lipomatous neoplasm of skin and subcutaneous tissue of trunk: Secondary | ICD-10-CM | POA: Diagnosis not present

## 2021-08-20 DIAGNOSIS — C679 Malignant neoplasm of bladder, unspecified: Secondary | ICD-10-CM | POA: Diagnosis not present

## 2021-08-24 DIAGNOSIS — Z8551 Personal history of malignant neoplasm of bladder: Secondary | ICD-10-CM | POA: Diagnosis not present

## 2021-08-24 DIAGNOSIS — Z952 Presence of prosthetic heart valve: Secondary | ICD-10-CM | POA: Diagnosis not present

## 2021-08-24 DIAGNOSIS — Z08 Encounter for follow-up examination after completed treatment for malignant neoplasm: Secondary | ICD-10-CM | POA: Diagnosis not present

## 2021-08-24 DIAGNOSIS — E1129 Type 2 diabetes mellitus with other diabetic kidney complication: Secondary | ICD-10-CM | POA: Diagnosis not present

## 2021-08-24 DIAGNOSIS — R809 Proteinuria, unspecified: Secondary | ICD-10-CM | POA: Diagnosis not present

## 2021-08-24 DIAGNOSIS — Z95818 Presence of other cardiac implants and grafts: Secondary | ICD-10-CM | POA: Diagnosis not present

## 2021-08-24 DIAGNOSIS — Z923 Personal history of irradiation: Secondary | ICD-10-CM | POA: Diagnosis not present

## 2021-08-24 DIAGNOSIS — I10 Essential (primary) hypertension: Secondary | ICD-10-CM | POA: Diagnosis not present

## 2021-08-24 DIAGNOSIS — I872 Venous insufficiency (chronic) (peripheral): Secondary | ICD-10-CM | POA: Diagnosis not present

## 2021-08-24 DIAGNOSIS — C670 Malignant neoplasm of trigone of bladder: Secondary | ICD-10-CM | POA: Diagnosis not present

## 2021-08-28 DIAGNOSIS — C670 Malignant neoplasm of trigone of bladder: Secondary | ICD-10-CM | POA: Diagnosis not present

## 2021-08-28 DIAGNOSIS — Z5111 Encounter for antineoplastic chemotherapy: Secondary | ICD-10-CM | POA: Diagnosis not present

## 2021-08-28 DIAGNOSIS — Z5112 Encounter for antineoplastic immunotherapy: Secondary | ICD-10-CM | POA: Diagnosis not present

## 2021-08-28 DIAGNOSIS — Z95828 Presence of other vascular implants and grafts: Secondary | ICD-10-CM | POA: Diagnosis not present

## 2021-09-04 DIAGNOSIS — R234 Changes in skin texture: Secondary | ICD-10-CM | POA: Diagnosis not present

## 2021-09-04 DIAGNOSIS — C679 Malignant neoplasm of bladder, unspecified: Secondary | ICD-10-CM | POA: Diagnosis not present

## 2021-09-04 DIAGNOSIS — I890 Lymphedema, not elsewhere classified: Secondary | ICD-10-CM | POA: Diagnosis not present

## 2021-09-04 DIAGNOSIS — C670 Malignant neoplasm of trigone of bladder: Secondary | ICD-10-CM | POA: Diagnosis not present

## 2021-09-04 DIAGNOSIS — C7989 Secondary malignant neoplasm of other specified sites: Secondary | ICD-10-CM | POA: Diagnosis not present

## 2021-09-11 DIAGNOSIS — E1165 Type 2 diabetes mellitus with hyperglycemia: Secondary | ICD-10-CM | POA: Diagnosis not present

## 2021-09-13 DIAGNOSIS — C7989 Secondary malignant neoplasm of other specified sites: Secondary | ICD-10-CM | POA: Diagnosis not present

## 2021-09-13 DIAGNOSIS — C670 Malignant neoplasm of trigone of bladder: Secondary | ICD-10-CM | POA: Diagnosis not present

## 2021-09-13 DIAGNOSIS — I890 Lymphedema, not elsewhere classified: Secondary | ICD-10-CM | POA: Diagnosis not present

## 2021-09-17 DIAGNOSIS — C670 Malignant neoplasm of trigone of bladder: Secondary | ICD-10-CM | POA: Diagnosis not present

## 2021-09-17 DIAGNOSIS — E7849 Other hyperlipidemia: Secondary | ICD-10-CM | POA: Diagnosis not present

## 2021-09-17 DIAGNOSIS — Z923 Personal history of irradiation: Secondary | ICD-10-CM | POA: Diagnosis not present

## 2021-09-17 DIAGNOSIS — I158 Other secondary hypertension: Secondary | ICD-10-CM | POA: Diagnosis not present

## 2021-09-17 DIAGNOSIS — I10 Essential (primary) hypertension: Secondary | ICD-10-CM | POA: Diagnosis not present

## 2021-09-17 DIAGNOSIS — E1129 Type 2 diabetes mellitus with other diabetic kidney complication: Secondary | ICD-10-CM | POA: Diagnosis not present

## 2021-09-17 DIAGNOSIS — R809 Proteinuria, unspecified: Secondary | ICD-10-CM | POA: Diagnosis not present

## 2021-09-18 DIAGNOSIS — Z5112 Encounter for antineoplastic immunotherapy: Secondary | ICD-10-CM | POA: Diagnosis not present

## 2021-09-18 DIAGNOSIS — C670 Malignant neoplasm of trigone of bladder: Secondary | ICD-10-CM | POA: Diagnosis not present

## 2021-09-24 DIAGNOSIS — I890 Lymphedema, not elsewhere classified: Secondary | ICD-10-CM | POA: Diagnosis not present

## 2021-09-24 DIAGNOSIS — C670 Malignant neoplasm of trigone of bladder: Secondary | ICD-10-CM | POA: Diagnosis not present

## 2021-09-24 DIAGNOSIS — C7989 Secondary malignant neoplasm of other specified sites: Secondary | ICD-10-CM | POA: Diagnosis not present

## 2021-09-26 DIAGNOSIS — C670 Malignant neoplasm of trigone of bladder: Secondary | ICD-10-CM | POA: Diagnosis not present

## 2021-09-26 DIAGNOSIS — I890 Lymphedema, not elsewhere classified: Secondary | ICD-10-CM | POA: Diagnosis not present

## 2021-09-26 DIAGNOSIS — C7989 Secondary malignant neoplasm of other specified sites: Secondary | ICD-10-CM | POA: Diagnosis not present

## 2021-10-01 DIAGNOSIS — C670 Malignant neoplasm of trigone of bladder: Secondary | ICD-10-CM | POA: Diagnosis not present

## 2021-10-01 DIAGNOSIS — C7989 Secondary malignant neoplasm of other specified sites: Secondary | ICD-10-CM | POA: Diagnosis not present

## 2021-10-01 DIAGNOSIS — I890 Lymphedema, not elsewhere classified: Secondary | ICD-10-CM | POA: Diagnosis not present

## 2021-10-03 DIAGNOSIS — I890 Lymphedema, not elsewhere classified: Secondary | ICD-10-CM | POA: Diagnosis not present

## 2021-10-03 DIAGNOSIS — C7989 Secondary malignant neoplasm of other specified sites: Secondary | ICD-10-CM | POA: Diagnosis not present

## 2021-10-03 DIAGNOSIS — C670 Malignant neoplasm of trigone of bladder: Secondary | ICD-10-CM | POA: Diagnosis not present

## 2021-10-05 DIAGNOSIS — C679 Malignant neoplasm of bladder, unspecified: Secondary | ICD-10-CM | POA: Diagnosis not present

## 2021-10-05 DIAGNOSIS — D8989 Other specified disorders involving the immune mechanism, not elsewhere classified: Secondary | ICD-10-CM | POA: Diagnosis not present

## 2021-10-05 DIAGNOSIS — Z923 Personal history of irradiation: Secondary | ICD-10-CM | POA: Diagnosis not present

## 2021-10-05 DIAGNOSIS — Z5111 Encounter for antineoplastic chemotherapy: Secondary | ICD-10-CM | POA: Diagnosis not present

## 2021-10-05 DIAGNOSIS — D62 Acute posthemorrhagic anemia: Secondary | ICD-10-CM | POA: Diagnosis not present

## 2021-10-05 DIAGNOSIS — Z95818 Presence of other cardiac implants and grafts: Secondary | ICD-10-CM | POA: Diagnosis not present

## 2021-10-05 DIAGNOSIS — Z1159 Encounter for screening for other viral diseases: Secondary | ICD-10-CM | POA: Diagnosis not present

## 2021-10-05 DIAGNOSIS — C670 Malignant neoplasm of trigone of bladder: Secondary | ICD-10-CM | POA: Diagnosis not present

## 2021-10-05 DIAGNOSIS — E079 Disorder of thyroid, unspecified: Secondary | ICD-10-CM | POA: Diagnosis not present

## 2021-10-05 DIAGNOSIS — I890 Lymphedema, not elsewhere classified: Secondary | ICD-10-CM | POA: Diagnosis not present

## 2021-10-05 DIAGNOSIS — I10 Essential (primary) hypertension: Secondary | ICD-10-CM | POA: Diagnosis not present

## 2021-10-05 DIAGNOSIS — Z95828 Presence of other vascular implants and grafts: Secondary | ICD-10-CM | POA: Diagnosis not present

## 2021-10-05 DIAGNOSIS — E7849 Other hyperlipidemia: Secondary | ICD-10-CM | POA: Diagnosis not present

## 2021-10-05 DIAGNOSIS — C7989 Secondary malignant neoplasm of other specified sites: Secondary | ICD-10-CM | POA: Diagnosis not present

## 2021-10-08 DIAGNOSIS — I890 Lymphedema, not elsewhere classified: Secondary | ICD-10-CM | POA: Diagnosis not present

## 2021-10-08 DIAGNOSIS — C670 Malignant neoplasm of trigone of bladder: Secondary | ICD-10-CM | POA: Diagnosis not present

## 2021-10-08 DIAGNOSIS — C7989 Secondary malignant neoplasm of other specified sites: Secondary | ICD-10-CM | POA: Diagnosis not present

## 2021-10-09 DIAGNOSIS — Z95828 Presence of other vascular implants and grafts: Secondary | ICD-10-CM | POA: Diagnosis not present

## 2021-10-09 DIAGNOSIS — C670 Malignant neoplasm of trigone of bladder: Secondary | ICD-10-CM | POA: Diagnosis not present

## 2021-10-09 DIAGNOSIS — Z66 Do not resuscitate: Secondary | ICD-10-CM | POA: Diagnosis not present

## 2021-10-09 DIAGNOSIS — Z5112 Encounter for antineoplastic immunotherapy: Secondary | ICD-10-CM | POA: Diagnosis not present

## 2021-10-10 DIAGNOSIS — I890 Lymphedema, not elsewhere classified: Secondary | ICD-10-CM | POA: Diagnosis not present

## 2021-10-10 DIAGNOSIS — C670 Malignant neoplasm of trigone of bladder: Secondary | ICD-10-CM | POA: Diagnosis not present

## 2021-10-10 DIAGNOSIS — C7989 Secondary malignant neoplasm of other specified sites: Secondary | ICD-10-CM | POA: Diagnosis not present

## 2021-10-11 DIAGNOSIS — E1165 Type 2 diabetes mellitus with hyperglycemia: Secondary | ICD-10-CM | POA: Diagnosis not present

## 2021-10-17 DIAGNOSIS — I350 Nonrheumatic aortic (valve) stenosis: Secondary | ICD-10-CM | POA: Diagnosis not present

## 2021-10-17 DIAGNOSIS — I4821 Permanent atrial fibrillation: Secondary | ICD-10-CM | POA: Diagnosis not present

## 2021-10-17 DIAGNOSIS — I10 Essential (primary) hypertension: Secondary | ICD-10-CM | POA: Diagnosis not present

## 2021-10-18 DIAGNOSIS — I890 Lymphedema, not elsewhere classified: Secondary | ICD-10-CM | POA: Diagnosis not present

## 2021-10-18 DIAGNOSIS — C670 Malignant neoplasm of trigone of bladder: Secondary | ICD-10-CM | POA: Diagnosis not present

## 2021-10-18 DIAGNOSIS — C7989 Secondary malignant neoplasm of other specified sites: Secondary | ICD-10-CM | POA: Diagnosis not present

## 2021-10-29 DIAGNOSIS — C670 Malignant neoplasm of trigone of bladder: Secondary | ICD-10-CM | POA: Diagnosis not present

## 2021-10-29 DIAGNOSIS — I5032 Chronic diastolic (congestive) heart failure: Secondary | ICD-10-CM | POA: Diagnosis not present

## 2021-10-29 DIAGNOSIS — Z95818 Presence of other cardiac implants and grafts: Secondary | ICD-10-CM | POA: Diagnosis not present

## 2021-10-29 DIAGNOSIS — I4811 Longstanding persistent atrial fibrillation: Secondary | ICD-10-CM | POA: Diagnosis not present

## 2021-10-29 DIAGNOSIS — Z95828 Presence of other vascular implants and grafts: Secondary | ICD-10-CM | POA: Diagnosis not present

## 2021-10-30 DIAGNOSIS — C670 Malignant neoplasm of trigone of bladder: Secondary | ICD-10-CM | POA: Diagnosis not present

## 2021-10-30 DIAGNOSIS — Z66 Do not resuscitate: Secondary | ICD-10-CM | POA: Diagnosis not present

## 2021-10-30 DIAGNOSIS — Z95828 Presence of other vascular implants and grafts: Secondary | ICD-10-CM | POA: Diagnosis not present

## 2021-10-30 DIAGNOSIS — Z5112 Encounter for antineoplastic immunotherapy: Secondary | ICD-10-CM | POA: Diagnosis not present

## 2021-10-31 DIAGNOSIS — C670 Malignant neoplasm of trigone of bladder: Secondary | ICD-10-CM | POA: Diagnosis not present

## 2021-10-31 DIAGNOSIS — C7989 Secondary malignant neoplasm of other specified sites: Secondary | ICD-10-CM | POA: Diagnosis not present

## 2021-10-31 DIAGNOSIS — I890 Lymphedema, not elsewhere classified: Secondary | ICD-10-CM | POA: Diagnosis not present

## 2021-11-08 DIAGNOSIS — E1122 Type 2 diabetes mellitus with diabetic chronic kidney disease: Secondary | ICD-10-CM | POA: Diagnosis not present

## 2021-11-08 DIAGNOSIS — C7989 Secondary malignant neoplasm of other specified sites: Secondary | ICD-10-CM | POA: Diagnosis not present

## 2021-11-08 DIAGNOSIS — C679 Malignant neoplasm of bladder, unspecified: Secondary | ICD-10-CM | POA: Diagnosis not present

## 2021-11-08 DIAGNOSIS — C670 Malignant neoplasm of trigone of bladder: Secondary | ICD-10-CM | POA: Diagnosis not present

## 2021-11-08 DIAGNOSIS — I890 Lymphedema, not elsewhere classified: Secondary | ICD-10-CM | POA: Diagnosis not present

## 2021-11-08 DIAGNOSIS — Z299 Encounter for prophylactic measures, unspecified: Secondary | ICD-10-CM | POA: Diagnosis not present

## 2021-11-08 DIAGNOSIS — I10 Essential (primary) hypertension: Secondary | ICD-10-CM | POA: Diagnosis not present

## 2021-11-08 DIAGNOSIS — C799 Secondary malignant neoplasm of unspecified site: Secondary | ICD-10-CM | POA: Diagnosis not present

## 2021-11-08 DIAGNOSIS — E1165 Type 2 diabetes mellitus with hyperglycemia: Secondary | ICD-10-CM | POA: Diagnosis not present

## 2021-11-08 DIAGNOSIS — Z6824 Body mass index (BMI) 24.0-24.9, adult: Secondary | ICD-10-CM | POA: Diagnosis not present

## 2021-11-12 DIAGNOSIS — E1165 Type 2 diabetes mellitus with hyperglycemia: Secondary | ICD-10-CM | POA: Diagnosis not present

## 2021-11-16 DIAGNOSIS — C670 Malignant neoplasm of trigone of bladder: Secondary | ICD-10-CM | POA: Diagnosis not present

## 2021-11-16 DIAGNOSIS — E079 Disorder of thyroid, unspecified: Secondary | ICD-10-CM | POA: Diagnosis not present

## 2021-11-16 DIAGNOSIS — I890 Lymphedema, not elsewhere classified: Secondary | ICD-10-CM | POA: Diagnosis not present

## 2021-11-19 DIAGNOSIS — Z8551 Personal history of malignant neoplasm of bladder: Secondary | ICD-10-CM | POA: Diagnosis not present

## 2021-11-20 DIAGNOSIS — C670 Malignant neoplasm of trigone of bladder: Secondary | ICD-10-CM | POA: Diagnosis not present

## 2021-11-20 DIAGNOSIS — Z5112 Encounter for antineoplastic immunotherapy: Secondary | ICD-10-CM | POA: Diagnosis not present

## 2021-11-20 DIAGNOSIS — Z95828 Presence of other vascular implants and grafts: Secondary | ICD-10-CM | POA: Diagnosis not present

## 2021-12-10 DIAGNOSIS — Z7189 Other specified counseling: Secondary | ICD-10-CM | POA: Diagnosis not present

## 2021-12-10 DIAGNOSIS — C670 Malignant neoplasm of trigone of bladder: Secondary | ICD-10-CM | POA: Diagnosis not present

## 2021-12-10 DIAGNOSIS — E079 Disorder of thyroid, unspecified: Secondary | ICD-10-CM | POA: Diagnosis not present

## 2021-12-10 DIAGNOSIS — I10 Essential (primary) hypertension: Secondary | ICD-10-CM | POA: Diagnosis not present

## 2021-12-11 DIAGNOSIS — C670 Malignant neoplasm of trigone of bladder: Secondary | ICD-10-CM | POA: Diagnosis not present

## 2021-12-11 DIAGNOSIS — Z95828 Presence of other vascular implants and grafts: Secondary | ICD-10-CM | POA: Diagnosis not present

## 2021-12-11 DIAGNOSIS — Z5112 Encounter for antineoplastic immunotherapy: Secondary | ICD-10-CM | POA: Diagnosis not present

## 2021-12-12 DIAGNOSIS — E1165 Type 2 diabetes mellitus with hyperglycemia: Secondary | ICD-10-CM | POA: Diagnosis not present

## 2021-12-19 DIAGNOSIS — Z79899 Other long term (current) drug therapy: Secondary | ICD-10-CM | POA: Diagnosis not present

## 2021-12-19 DIAGNOSIS — E7800 Pure hypercholesterolemia, unspecified: Secondary | ICD-10-CM | POA: Diagnosis not present

## 2021-12-19 DIAGNOSIS — Z7189 Other specified counseling: Secondary | ICD-10-CM | POA: Diagnosis not present

## 2021-12-19 DIAGNOSIS — Z1339 Encounter for screening examination for other mental health and behavioral disorders: Secondary | ICD-10-CM | POA: Diagnosis not present

## 2021-12-19 DIAGNOSIS — R5383 Other fatigue: Secondary | ICD-10-CM | POA: Diagnosis not present

## 2021-12-19 DIAGNOSIS — I10 Essential (primary) hypertension: Secondary | ICD-10-CM | POA: Diagnosis not present

## 2021-12-19 DIAGNOSIS — Z0000 Encounter for general adult medical examination without abnormal findings: Secondary | ICD-10-CM | POA: Diagnosis not present

## 2021-12-19 DIAGNOSIS — Z6823 Body mass index (BMI) 23.0-23.9, adult: Secondary | ICD-10-CM | POA: Diagnosis not present

## 2021-12-19 DIAGNOSIS — Z299 Encounter for prophylactic measures, unspecified: Secondary | ICD-10-CM | POA: Diagnosis not present

## 2021-12-19 DIAGNOSIS — Z1331 Encounter for screening for depression: Secondary | ICD-10-CM | POA: Diagnosis not present

## 2021-12-19 DIAGNOSIS — Z23 Encounter for immunization: Secondary | ICD-10-CM | POA: Diagnosis not present

## 2021-12-31 DIAGNOSIS — Z08 Encounter for follow-up examination after completed treatment for malignant neoplasm: Secondary | ICD-10-CM | POA: Diagnosis not present

## 2021-12-31 DIAGNOSIS — C670 Malignant neoplasm of trigone of bladder: Secondary | ICD-10-CM | POA: Diagnosis not present

## 2021-12-31 DIAGNOSIS — Z8551 Personal history of malignant neoplasm of bladder: Secondary | ICD-10-CM | POA: Diagnosis not present

## 2022-01-01 DIAGNOSIS — M2550 Pain in unspecified joint: Secondary | ICD-10-CM | POA: Diagnosis not present

## 2022-01-01 DIAGNOSIS — I2510 Atherosclerotic heart disease of native coronary artery without angina pectoris: Secondary | ICD-10-CM | POA: Diagnosis not present

## 2022-01-01 DIAGNOSIS — C7989 Secondary malignant neoplasm of other specified sites: Secondary | ICD-10-CM | POA: Diagnosis not present

## 2022-01-01 DIAGNOSIS — I350 Nonrheumatic aortic (valve) stenosis: Secondary | ICD-10-CM | POA: Diagnosis not present

## 2022-01-01 DIAGNOSIS — Z66 Do not resuscitate: Secondary | ICD-10-CM | POA: Diagnosis not present

## 2022-01-01 DIAGNOSIS — I509 Heart failure, unspecified: Secondary | ICD-10-CM | POA: Diagnosis not present

## 2022-01-01 DIAGNOSIS — C670 Malignant neoplasm of trigone of bladder: Secondary | ICD-10-CM | POA: Diagnosis not present

## 2022-01-01 DIAGNOSIS — E119 Type 2 diabetes mellitus without complications: Secondary | ICD-10-CM | POA: Diagnosis not present

## 2022-01-01 DIAGNOSIS — Z8673 Personal history of transient ischemic attack (TIA), and cerebral infarction without residual deficits: Secondary | ICD-10-CM | POA: Diagnosis not present

## 2022-01-01 DIAGNOSIS — C772 Secondary and unspecified malignant neoplasm of intra-abdominal lymph nodes: Secondary | ICD-10-CM | POA: Diagnosis not present

## 2022-01-01 DIAGNOSIS — I110 Hypertensive heart disease with heart failure: Secondary | ICD-10-CM | POA: Diagnosis not present

## 2022-01-01 DIAGNOSIS — R350 Frequency of micturition: Secondary | ICD-10-CM | POA: Diagnosis not present

## 2022-01-01 DIAGNOSIS — Z5112 Encounter for antineoplastic immunotherapy: Secondary | ICD-10-CM | POA: Diagnosis not present

## 2022-01-04 DIAGNOSIS — K8020 Calculus of gallbladder without cholecystitis without obstruction: Secondary | ICD-10-CM | POA: Diagnosis not present

## 2022-01-04 DIAGNOSIS — J948 Other specified pleural conditions: Secondary | ICD-10-CM | POA: Diagnosis not present

## 2022-01-04 DIAGNOSIS — I700 Atherosclerosis of aorta: Secondary | ICD-10-CM | POA: Diagnosis not present

## 2022-01-04 DIAGNOSIS — N5089 Other specified disorders of the male genital organs: Secondary | ICD-10-CM | POA: Diagnosis not present

## 2022-01-04 DIAGNOSIS — C679 Malignant neoplasm of bladder, unspecified: Secondary | ICD-10-CM | POA: Diagnosis not present

## 2022-01-04 DIAGNOSIS — C670 Malignant neoplasm of trigone of bladder: Secondary | ICD-10-CM | POA: Diagnosis not present

## 2022-01-04 DIAGNOSIS — J929 Pleural plaque without asbestos: Secondary | ICD-10-CM | POA: Diagnosis not present

## 2022-01-11 DIAGNOSIS — E1165 Type 2 diabetes mellitus with hyperglycemia: Secondary | ICD-10-CM | POA: Diagnosis not present

## 2022-01-21 DIAGNOSIS — C670 Malignant neoplasm of trigone of bladder: Secondary | ICD-10-CM | POA: Diagnosis not present

## 2022-01-21 DIAGNOSIS — I10 Essential (primary) hypertension: Secondary | ICD-10-CM | POA: Diagnosis not present

## 2022-01-22 DIAGNOSIS — C7989 Secondary malignant neoplasm of other specified sites: Secondary | ICD-10-CM | POA: Diagnosis not present

## 2022-01-22 DIAGNOSIS — Z79899 Other long term (current) drug therapy: Secondary | ICD-10-CM | POA: Diagnosis not present

## 2022-01-22 DIAGNOSIS — C772 Secondary and unspecified malignant neoplasm of intra-abdominal lymph nodes: Secondary | ICD-10-CM | POA: Diagnosis not present

## 2022-01-22 DIAGNOSIS — I2510 Atherosclerotic heart disease of native coronary artery without angina pectoris: Secondary | ICD-10-CM | POA: Diagnosis not present

## 2022-01-22 DIAGNOSIS — I700 Atherosclerosis of aorta: Secondary | ICD-10-CM | POA: Diagnosis not present

## 2022-01-22 DIAGNOSIS — I110 Hypertensive heart disease with heart failure: Secondary | ICD-10-CM | POA: Diagnosis not present

## 2022-01-22 DIAGNOSIS — E119 Type 2 diabetes mellitus without complications: Secondary | ICD-10-CM | POA: Diagnosis not present

## 2022-01-22 DIAGNOSIS — Z5112 Encounter for antineoplastic immunotherapy: Secondary | ICD-10-CM | POA: Diagnosis not present

## 2022-01-22 DIAGNOSIS — I509 Heart failure, unspecified: Secondary | ICD-10-CM | POA: Diagnosis not present

## 2022-01-22 DIAGNOSIS — Z8673 Personal history of transient ischemic attack (TIA), and cerebral infarction without residual deficits: Secondary | ICD-10-CM | POA: Diagnosis not present

## 2022-01-22 DIAGNOSIS — C670 Malignant neoplasm of trigone of bladder: Secondary | ICD-10-CM | POA: Diagnosis not present

## 2022-02-11 ENCOUNTER — Encounter (INDEPENDENT_AMBULATORY_CARE_PROVIDER_SITE_OTHER): Payer: Self-pay

## 2022-02-11 DIAGNOSIS — I10 Essential (primary) hypertension: Secondary | ICD-10-CM | POA: Diagnosis not present

## 2022-02-11 DIAGNOSIS — C670 Malignant neoplasm of trigone of bladder: Secondary | ICD-10-CM | POA: Diagnosis not present

## 2022-02-11 DIAGNOSIS — E1165 Type 2 diabetes mellitus with hyperglycemia: Secondary | ICD-10-CM | POA: Diagnosis not present

## 2022-02-11 DIAGNOSIS — E7849 Other hyperlipidemia: Secondary | ICD-10-CM | POA: Diagnosis not present

## 2022-02-11 DIAGNOSIS — D62 Acute posthemorrhagic anemia: Secondary | ICD-10-CM | POA: Diagnosis not present

## 2022-02-11 DIAGNOSIS — I9589 Other hypotension: Secondary | ICD-10-CM | POA: Diagnosis not present

## 2022-02-12 DIAGNOSIS — I10 Essential (primary) hypertension: Secondary | ICD-10-CM | POA: Diagnosis not present

## 2022-02-12 DIAGNOSIS — Z95828 Presence of other vascular implants and grafts: Secondary | ICD-10-CM | POA: Diagnosis not present

## 2022-02-12 DIAGNOSIS — I5032 Chronic diastolic (congestive) heart failure: Secondary | ICD-10-CM | POA: Diagnosis not present

## 2022-02-12 DIAGNOSIS — Z6823 Body mass index (BMI) 23.0-23.9, adult: Secondary | ICD-10-CM | POA: Diagnosis not present

## 2022-02-12 DIAGNOSIS — Z299 Encounter for prophylactic measures, unspecified: Secondary | ICD-10-CM | POA: Diagnosis not present

## 2022-02-12 DIAGNOSIS — C670 Malignant neoplasm of trigone of bladder: Secondary | ICD-10-CM | POA: Diagnosis not present

## 2022-02-12 DIAGNOSIS — E1165 Type 2 diabetes mellitus with hyperglycemia: Secondary | ICD-10-CM | POA: Diagnosis not present

## 2022-02-12 DIAGNOSIS — Z5112 Encounter for antineoplastic immunotherapy: Secondary | ICD-10-CM | POA: Diagnosis not present

## 2022-03-04 DIAGNOSIS — C670 Malignant neoplasm of trigone of bladder: Secondary | ICD-10-CM | POA: Diagnosis not present

## 2022-03-04 DIAGNOSIS — I4811 Longstanding persistent atrial fibrillation: Secondary | ICD-10-CM | POA: Diagnosis not present

## 2022-03-05 DIAGNOSIS — Z5111 Encounter for antineoplastic chemotherapy: Secondary | ICD-10-CM | POA: Diagnosis not present

## 2022-03-05 DIAGNOSIS — C772 Secondary and unspecified malignant neoplasm of intra-abdominal lymph nodes: Secondary | ICD-10-CM | POA: Diagnosis not present

## 2022-03-05 DIAGNOSIS — I2510 Atherosclerotic heart disease of native coronary artery without angina pectoris: Secondary | ICD-10-CM | POA: Diagnosis not present

## 2022-03-05 DIAGNOSIS — J929 Pleural plaque without asbestos: Secondary | ICD-10-CM | POA: Diagnosis not present

## 2022-03-05 DIAGNOSIS — Z66 Do not resuscitate: Secondary | ICD-10-CM | POA: Diagnosis not present

## 2022-03-05 DIAGNOSIS — K8020 Calculus of gallbladder without cholecystitis without obstruction: Secondary | ICD-10-CM | POA: Diagnosis not present

## 2022-03-05 DIAGNOSIS — C670 Malignant neoplasm of trigone of bladder: Secondary | ICD-10-CM | POA: Diagnosis not present

## 2022-03-05 DIAGNOSIS — N5089 Other specified disorders of the male genital organs: Secondary | ICD-10-CM | POA: Diagnosis not present

## 2022-03-05 DIAGNOSIS — C7989 Secondary malignant neoplasm of other specified sites: Secondary | ICD-10-CM | POA: Diagnosis not present

## 2022-03-05 DIAGNOSIS — M25512 Pain in left shoulder: Secondary | ICD-10-CM | POA: Diagnosis not present

## 2022-03-05 DIAGNOSIS — I700 Atherosclerosis of aorta: Secondary | ICD-10-CM | POA: Diagnosis not present

## 2022-03-05 DIAGNOSIS — E875 Hyperkalemia: Secondary | ICD-10-CM | POA: Diagnosis not present

## 2022-03-13 DIAGNOSIS — E1165 Type 2 diabetes mellitus with hyperglycemia: Secondary | ICD-10-CM | POA: Diagnosis not present

## 2022-03-25 DIAGNOSIS — M542 Cervicalgia: Secondary | ICD-10-CM | POA: Diagnosis not present

## 2022-03-25 DIAGNOSIS — N1831 Chronic kidney disease, stage 3a: Secondary | ICD-10-CM | POA: Diagnosis not present

## 2022-03-25 DIAGNOSIS — C670 Malignant neoplasm of trigone of bladder: Secondary | ICD-10-CM | POA: Diagnosis not present

## 2022-03-25 DIAGNOSIS — I4811 Longstanding persistent atrial fibrillation: Secondary | ICD-10-CM | POA: Diagnosis not present

## 2022-03-25 DIAGNOSIS — G8929 Other chronic pain: Secondary | ICD-10-CM | POA: Diagnosis not present

## 2022-03-25 DIAGNOSIS — I4821 Permanent atrial fibrillation: Secondary | ICD-10-CM | POA: Diagnosis not present

## 2022-03-25 DIAGNOSIS — E875 Hyperkalemia: Secondary | ICD-10-CM | POA: Diagnosis not present

## 2022-03-26 DIAGNOSIS — Z5112 Encounter for antineoplastic immunotherapy: Secondary | ICD-10-CM | POA: Diagnosis not present

## 2022-03-26 DIAGNOSIS — C670 Malignant neoplasm of trigone of bladder: Secondary | ICD-10-CM | POA: Diagnosis not present

## 2022-03-28 DIAGNOSIS — C679 Malignant neoplasm of bladder, unspecified: Secondary | ICD-10-CM | POA: Diagnosis not present

## 2022-03-28 DIAGNOSIS — R918 Other nonspecific abnormal finding of lung field: Secondary | ICD-10-CM | POA: Diagnosis not present

## 2022-03-28 DIAGNOSIS — N1831 Chronic kidney disease, stage 3a: Secondary | ICD-10-CM | POA: Diagnosis not present

## 2022-03-28 DIAGNOSIS — K4020 Bilateral inguinal hernia, without obstruction or gangrene, not specified as recurrent: Secondary | ICD-10-CM | POA: Diagnosis not present

## 2022-03-28 DIAGNOSIS — C670 Malignant neoplasm of trigone of bladder: Secondary | ICD-10-CM | POA: Diagnosis not present

## 2022-03-28 DIAGNOSIS — J929 Pleural plaque without asbestos: Secondary | ICD-10-CM | POA: Diagnosis not present

## 2022-03-28 DIAGNOSIS — K8020 Calculus of gallbladder without cholecystitis without obstruction: Secondary | ICD-10-CM | POA: Diagnosis not present

## 2022-03-28 DIAGNOSIS — N281 Cyst of kidney, acquired: Secondary | ICD-10-CM | POA: Diagnosis not present

## 2022-03-28 DIAGNOSIS — J948 Other specified pleural conditions: Secondary | ICD-10-CM | POA: Diagnosis not present

## 2022-04-12 DIAGNOSIS — C670 Malignant neoplasm of trigone of bladder: Secondary | ICD-10-CM | POA: Diagnosis not present

## 2022-04-12 DIAGNOSIS — Z08 Encounter for follow-up examination after completed treatment for malignant neoplasm: Secondary | ICD-10-CM | POA: Diagnosis not present

## 2022-04-12 DIAGNOSIS — I4811 Longstanding persistent atrial fibrillation: Secondary | ICD-10-CM | POA: Diagnosis not present

## 2022-04-12 DIAGNOSIS — Z8551 Personal history of malignant neoplasm of bladder: Secondary | ICD-10-CM | POA: Diagnosis not present

## 2022-04-12 DIAGNOSIS — I350 Nonrheumatic aortic (valve) stenosis: Secondary | ICD-10-CM | POA: Diagnosis not present

## 2022-04-12 DIAGNOSIS — E039 Hypothyroidism, unspecified: Secondary | ICD-10-CM | POA: Diagnosis not present

## 2022-04-12 DIAGNOSIS — E1165 Type 2 diabetes mellitus with hyperglycemia: Secondary | ICD-10-CM | POA: Diagnosis not present

## 2022-04-16 DIAGNOSIS — C670 Malignant neoplasm of trigone of bladder: Secondary | ICD-10-CM | POA: Diagnosis not present

## 2022-04-16 DIAGNOSIS — Z5112 Encounter for antineoplastic immunotherapy: Secondary | ICD-10-CM | POA: Diagnosis not present

## 2022-04-25 DIAGNOSIS — I4811 Longstanding persistent atrial fibrillation: Secondary | ICD-10-CM | POA: Diagnosis not present

## 2022-04-25 DIAGNOSIS — I2510 Atherosclerotic heart disease of native coronary artery without angina pectoris: Secondary | ICD-10-CM | POA: Diagnosis not present

## 2022-04-25 DIAGNOSIS — I350 Nonrheumatic aortic (valve) stenosis: Secondary | ICD-10-CM | POA: Diagnosis not present

## 2022-04-25 DIAGNOSIS — I5032 Chronic diastolic (congestive) heart failure: Secondary | ICD-10-CM | POA: Diagnosis not present

## 2022-05-06 DIAGNOSIS — C670 Malignant neoplasm of trigone of bladder: Secondary | ICD-10-CM | POA: Diagnosis not present

## 2022-05-06 DIAGNOSIS — M542 Cervicalgia: Secondary | ICD-10-CM | POA: Diagnosis not present

## 2022-05-06 DIAGNOSIS — E039 Hypothyroidism, unspecified: Secondary | ICD-10-CM | POA: Diagnosis not present

## 2022-05-07 DIAGNOSIS — Z5112 Encounter for antineoplastic immunotherapy: Secondary | ICD-10-CM | POA: Diagnosis not present

## 2022-05-07 DIAGNOSIS — C670 Malignant neoplasm of trigone of bladder: Secondary | ICD-10-CM | POA: Diagnosis not present

## 2022-05-13 DIAGNOSIS — E860 Dehydration: Secondary | ICD-10-CM | POA: Diagnosis not present

## 2022-05-13 DIAGNOSIS — E7800 Pure hypercholesterolemia, unspecified: Secondary | ICD-10-CM | POA: Diagnosis not present

## 2022-05-13 DIAGNOSIS — E119 Type 2 diabetes mellitus without complications: Secondary | ICD-10-CM | POA: Diagnosis not present

## 2022-05-13 DIAGNOSIS — M542 Cervicalgia: Secondary | ICD-10-CM | POA: Diagnosis not present

## 2022-05-13 DIAGNOSIS — Z87891 Personal history of nicotine dependence: Secondary | ICD-10-CM | POA: Diagnosis not present

## 2022-05-13 DIAGNOSIS — R531 Weakness: Secondary | ICD-10-CM | POA: Diagnosis not present

## 2022-05-13 DIAGNOSIS — E1165 Type 2 diabetes mellitus with hyperglycemia: Secondary | ICD-10-CM | POA: Diagnosis not present

## 2022-05-13 DIAGNOSIS — M47812 Spondylosis without myelopathy or radiculopathy, cervical region: Secondary | ICD-10-CM | POA: Diagnosis not present

## 2022-05-13 DIAGNOSIS — Z7902 Long term (current) use of antithrombotics/antiplatelets: Secondary | ICD-10-CM | POA: Diagnosis not present

## 2022-05-13 DIAGNOSIS — I4811 Longstanding persistent atrial fibrillation: Secondary | ICD-10-CM | POA: Diagnosis not present

## 2022-05-13 DIAGNOSIS — I10 Essential (primary) hypertension: Secondary | ICD-10-CM | POA: Diagnosis not present

## 2022-05-13 DIAGNOSIS — Z7982 Long term (current) use of aspirin: Secondary | ICD-10-CM | POA: Diagnosis not present

## 2022-05-13 DIAGNOSIS — R634 Abnormal weight loss: Secondary | ICD-10-CM | POA: Diagnosis not present

## 2022-05-13 DIAGNOSIS — I2510 Atherosclerotic heart disease of native coronary artery without angina pectoris: Secondary | ICD-10-CM | POA: Diagnosis not present

## 2022-05-20 DIAGNOSIS — R339 Retention of urine, unspecified: Secondary | ICD-10-CM | POA: Diagnosis not present

## 2022-05-20 DIAGNOSIS — I4891 Unspecified atrial fibrillation: Secondary | ICD-10-CM | POA: Diagnosis not present

## 2022-05-20 DIAGNOSIS — Z66 Do not resuscitate: Secondary | ICD-10-CM | POA: Diagnosis present

## 2022-05-20 DIAGNOSIS — J690 Pneumonitis due to inhalation of food and vomit: Secondary | ICD-10-CM | POA: Diagnosis not present

## 2022-05-20 DIAGNOSIS — R6521 Severe sepsis with septic shock: Secondary | ICD-10-CM | POA: Diagnosis not present

## 2022-05-20 DIAGNOSIS — Z888 Allergy status to other drugs, medicaments and biological substances status: Secondary | ICD-10-CM | POA: Diagnosis not present

## 2022-05-20 DIAGNOSIS — E860 Dehydration: Secondary | ICD-10-CM | POA: Diagnosis not present

## 2022-05-20 DIAGNOSIS — I4811 Longstanding persistent atrial fibrillation: Secondary | ICD-10-CM | POA: Diagnosis not present

## 2022-05-20 DIAGNOSIS — R4701 Aphasia: Secondary | ICD-10-CM | POA: Diagnosis not present

## 2022-05-20 DIAGNOSIS — Z794 Long term (current) use of insulin: Secondary | ICD-10-CM | POA: Diagnosis not present

## 2022-05-20 DIAGNOSIS — Z955 Presence of coronary angioplasty implant and graft: Secondary | ICD-10-CM | POA: Diagnosis not present

## 2022-05-20 DIAGNOSIS — C679 Malignant neoplasm of bladder, unspecified: Secondary | ICD-10-CM | POA: Diagnosis present

## 2022-05-20 DIAGNOSIS — I350 Nonrheumatic aortic (valve) stenosis: Secondary | ICD-10-CM | POA: Diagnosis not present

## 2022-05-20 DIAGNOSIS — I10 Essential (primary) hypertension: Secondary | ICD-10-CM | POA: Diagnosis not present

## 2022-05-20 DIAGNOSIS — C670 Malignant neoplasm of trigone of bladder: Secondary | ICD-10-CM | POA: Diagnosis not present

## 2022-05-20 DIAGNOSIS — Z7984 Long term (current) use of oral hypoglycemic drugs: Secondary | ICD-10-CM | POA: Diagnosis not present

## 2022-05-20 DIAGNOSIS — N170 Acute kidney failure with tubular necrosis: Secondary | ICD-10-CM | POA: Diagnosis not present

## 2022-05-20 DIAGNOSIS — G9341 Metabolic encephalopathy: Secondary | ICD-10-CM | POA: Diagnosis not present

## 2022-05-20 DIAGNOSIS — E8729 Other acidosis: Secondary | ICD-10-CM | POA: Diagnosis not present

## 2022-05-20 DIAGNOSIS — J9602 Acute respiratory failure with hypercapnia: Secondary | ICD-10-CM | POA: Diagnosis not present

## 2022-05-20 DIAGNOSIS — Z79891 Long term (current) use of opiate analgesic: Secondary | ICD-10-CM | POA: Diagnosis not present

## 2022-05-20 DIAGNOSIS — T17900D Unspecified foreign body in respiratory tract, part unspecified causing asphyxiation, subsequent encounter: Secondary | ICD-10-CM | POA: Diagnosis not present

## 2022-05-20 DIAGNOSIS — I25119 Atherosclerotic heart disease of native coronary artery with unspecified angina pectoris: Secondary | ICD-10-CM | POA: Diagnosis present

## 2022-05-20 DIAGNOSIS — R000 Tachycardia, unspecified: Secondary | ICD-10-CM | POA: Diagnosis not present

## 2022-05-20 DIAGNOSIS — E785 Hyperlipidemia, unspecified: Secondary | ICD-10-CM | POA: Diagnosis not present

## 2022-05-20 DIAGNOSIS — Z7982 Long term (current) use of aspirin: Secondary | ICD-10-CM | POA: Diagnosis not present

## 2022-05-20 DIAGNOSIS — I25110 Atherosclerotic heart disease of native coronary artery with unstable angina pectoris: Secondary | ICD-10-CM | POA: Diagnosis not present

## 2022-05-20 DIAGNOSIS — E7800 Pure hypercholesterolemia, unspecified: Secondary | ICD-10-CM | POA: Diagnosis present

## 2022-05-20 DIAGNOSIS — Z7902 Long term (current) use of antithrombotics/antiplatelets: Secondary | ICD-10-CM | POA: Diagnosis not present

## 2022-05-20 DIAGNOSIS — R571 Hypovolemic shock: Secondary | ICD-10-CM | POA: Diagnosis not present

## 2022-05-20 DIAGNOSIS — E440 Moderate protein-calorie malnutrition: Secondary | ICD-10-CM | POA: Diagnosis not present

## 2022-05-20 DIAGNOSIS — Z9841 Cataract extraction status, right eye: Secondary | ICD-10-CM | POA: Diagnosis not present

## 2022-05-20 DIAGNOSIS — Z87891 Personal history of nicotine dependence: Secondary | ICD-10-CM | POA: Diagnosis not present

## 2022-05-20 DIAGNOSIS — M25511 Pain in right shoulder: Secondary | ICD-10-CM | POA: Diagnosis not present

## 2022-05-20 DIAGNOSIS — I959 Hypotension, unspecified: Secondary | ICD-10-CM | POA: Diagnosis present

## 2022-05-20 DIAGNOSIS — Z79899 Other long term (current) drug therapy: Secondary | ICD-10-CM | POA: Diagnosis not present

## 2022-05-20 DIAGNOSIS — R0902 Hypoxemia: Secondary | ICD-10-CM | POA: Diagnosis not present

## 2022-05-20 DIAGNOSIS — Z7901 Long term (current) use of anticoagulants: Secondary | ICD-10-CM | POA: Diagnosis not present

## 2022-05-20 DIAGNOSIS — R4182 Altered mental status, unspecified: Secondary | ICD-10-CM | POA: Diagnosis not present

## 2022-05-20 DIAGNOSIS — E039 Hypothyroidism, unspecified: Secondary | ICD-10-CM | POA: Diagnosis not present

## 2022-05-20 DIAGNOSIS — R509 Fever, unspecified: Secondary | ICD-10-CM | POA: Diagnosis not present

## 2022-05-20 DIAGNOSIS — U071 COVID-19: Secondary | ICD-10-CM | POA: Diagnosis not present

## 2022-05-20 DIAGNOSIS — E119 Type 2 diabetes mellitus without complications: Secondary | ICD-10-CM | POA: Diagnosis not present

## 2022-05-20 DIAGNOSIS — A419 Sepsis, unspecified organism: Secondary | ICD-10-CM | POA: Diagnosis not present

## 2022-05-20 DIAGNOSIS — J811 Chronic pulmonary edema: Secondary | ICD-10-CM | POA: Diagnosis not present

## 2022-05-20 DIAGNOSIS — R1310 Dysphagia, unspecified: Secondary | ICD-10-CM | POA: Diagnosis not present

## 2022-05-21 DIAGNOSIS — M25511 Pain in right shoulder: Secondary | ICD-10-CM | POA: Diagnosis not present

## 2022-05-21 DIAGNOSIS — U071 COVID-19: Secondary | ICD-10-CM | POA: Diagnosis not present

## 2022-05-21 DIAGNOSIS — I4891 Unspecified atrial fibrillation: Secondary | ICD-10-CM | POA: Diagnosis not present

## 2022-05-21 DIAGNOSIS — R571 Hypovolemic shock: Secondary | ICD-10-CM | POA: Diagnosis not present

## 2022-05-21 DIAGNOSIS — G9341 Metabolic encephalopathy: Secondary | ICD-10-CM | POA: Diagnosis not present

## 2022-05-21 DIAGNOSIS — R4182 Altered mental status, unspecified: Secondary | ICD-10-CM | POA: Diagnosis not present

## 2022-05-21 DIAGNOSIS — E860 Dehydration: Secondary | ICD-10-CM | POA: Diagnosis not present

## 2022-05-22 DIAGNOSIS — Z7901 Long term (current) use of anticoagulants: Secondary | ICD-10-CM | POA: Diagnosis not present

## 2022-05-22 DIAGNOSIS — E039 Hypothyroidism, unspecified: Secondary | ICD-10-CM | POA: Diagnosis not present

## 2022-05-22 DIAGNOSIS — I10 Essential (primary) hypertension: Secondary | ICD-10-CM | POA: Diagnosis not present

## 2022-05-22 DIAGNOSIS — E785 Hyperlipidemia, unspecified: Secondary | ICD-10-CM | POA: Diagnosis not present

## 2022-05-22 DIAGNOSIS — R571 Hypovolemic shock: Secondary | ICD-10-CM | POA: Diagnosis not present

## 2022-05-22 DIAGNOSIS — R1310 Dysphagia, unspecified: Secondary | ICD-10-CM | POA: Diagnosis not present

## 2022-05-22 DIAGNOSIS — C679 Malignant neoplasm of bladder, unspecified: Secondary | ICD-10-CM | POA: Diagnosis not present

## 2022-05-22 DIAGNOSIS — I4891 Unspecified atrial fibrillation: Secondary | ICD-10-CM | POA: Diagnosis not present

## 2022-05-22 DIAGNOSIS — R339 Retention of urine, unspecified: Secondary | ICD-10-CM | POA: Diagnosis not present

## 2022-05-22 DIAGNOSIS — G9341 Metabolic encephalopathy: Secondary | ICD-10-CM | POA: Diagnosis not present

## 2022-05-22 DIAGNOSIS — U071 COVID-19: Secondary | ICD-10-CM | POA: Diagnosis not present

## 2022-05-22 DIAGNOSIS — R4182 Altered mental status, unspecified: Secondary | ICD-10-CM | POA: Diagnosis not present

## 2022-05-23 DIAGNOSIS — E785 Hyperlipidemia, unspecified: Secondary | ICD-10-CM | POA: Diagnosis not present

## 2022-05-23 DIAGNOSIS — Z79899 Other long term (current) drug therapy: Secondary | ICD-10-CM | POA: Diagnosis not present

## 2022-05-23 DIAGNOSIS — R571 Hypovolemic shock: Secondary | ICD-10-CM | POA: Diagnosis not present

## 2022-05-23 DIAGNOSIS — E039 Hypothyroidism, unspecified: Secondary | ICD-10-CM | POA: Diagnosis not present

## 2022-05-23 DIAGNOSIS — U071 COVID-19: Secondary | ICD-10-CM | POA: Diagnosis not present

## 2022-05-23 DIAGNOSIS — C679 Malignant neoplasm of bladder, unspecified: Secondary | ICD-10-CM | POA: Diagnosis not present

## 2022-05-23 DIAGNOSIS — I4891 Unspecified atrial fibrillation: Secondary | ICD-10-CM | POA: Diagnosis not present

## 2022-05-23 DIAGNOSIS — R339 Retention of urine, unspecified: Secondary | ICD-10-CM | POA: Diagnosis not present

## 2022-05-23 DIAGNOSIS — R1310 Dysphagia, unspecified: Secondary | ICD-10-CM | POA: Diagnosis not present

## 2022-05-23 DIAGNOSIS — I10 Essential (primary) hypertension: Secondary | ICD-10-CM | POA: Diagnosis not present

## 2022-05-23 DIAGNOSIS — R4182 Altered mental status, unspecified: Secondary | ICD-10-CM | POA: Diagnosis not present

## 2022-05-23 DIAGNOSIS — G9341 Metabolic encephalopathy: Secondary | ICD-10-CM | POA: Diagnosis not present

## 2022-05-24 DIAGNOSIS — J811 Chronic pulmonary edema: Secondary | ICD-10-CM | POA: Diagnosis not present
